# Patient Record
Sex: Female | Born: 1940 | Race: Black or African American | Hispanic: No | Smoking: Never smoker
Health system: Southern US, Community
[De-identification: ages and names within clinical notes are randomized; demographics above are authoritative.]

## PROBLEM LIST (undated history)

## (undated) DIAGNOSIS — I10 Essential (primary) hypertension: Secondary | ICD-10-CM

## (undated) DIAGNOSIS — M1990 Unspecified osteoarthritis, unspecified site: Secondary | ICD-10-CM

---

## 2004-05-22 HISTORY — PX: ELBOW ARTHROPLASTY: SHX928

## 2011-05-23 HISTORY — PX: REPLACEMENT TOTAL KNEE: SUR1224

## 2012-05-22 HISTORY — PX: REPLACEMENT TOTAL KNEE: SUR1224

## 2015-06-11 DIAGNOSIS — I10 Essential (primary) hypertension: Secondary | ICD-10-CM | POA: Diagnosis not present

## 2015-06-11 DIAGNOSIS — Z0000 Encounter for general adult medical examination without abnormal findings: Secondary | ICD-10-CM | POA: Diagnosis not present

## 2015-07-24 DIAGNOSIS — M542 Cervicalgia: Secondary | ICD-10-CM | POA: Diagnosis not present

## 2015-07-24 DIAGNOSIS — Z96621 Presence of right artificial elbow joint: Secondary | ICD-10-CM | POA: Diagnosis not present

## 2015-07-24 DIAGNOSIS — M1990 Unspecified osteoarthritis, unspecified site: Secondary | ICD-10-CM | POA: Diagnosis not present

## 2015-07-24 DIAGNOSIS — Z79899 Other long term (current) drug therapy: Secondary | ICD-10-CM | POA: Diagnosis not present

## 2015-07-24 DIAGNOSIS — M5030 Other cervical disc degeneration, unspecified cervical region: Secondary | ICD-10-CM | POA: Diagnosis not present

## 2015-07-24 DIAGNOSIS — M810 Age-related osteoporosis without current pathological fracture: Secondary | ICD-10-CM | POA: Diagnosis not present

## 2015-07-24 DIAGNOSIS — Z96653 Presence of artificial knee joint, bilateral: Secondary | ICD-10-CM | POA: Diagnosis not present

## 2015-07-24 DIAGNOSIS — I10 Essential (primary) hypertension: Secondary | ICD-10-CM | POA: Diagnosis not present

## 2015-07-24 DIAGNOSIS — M5090 Cervical disc disorder, unspecified, unspecified cervical region: Secondary | ICD-10-CM | POA: Diagnosis not present

## 2015-09-10 DIAGNOSIS — I10 Essential (primary) hypertension: Secondary | ICD-10-CM | POA: Diagnosis not present

## 2015-09-10 DIAGNOSIS — Z0000 Encounter for general adult medical examination without abnormal findings: Secondary | ICD-10-CM | POA: Diagnosis not present

## 2015-09-10 DIAGNOSIS — M542 Cervicalgia: Secondary | ICD-10-CM | POA: Diagnosis not present

## 2015-09-10 DIAGNOSIS — Z1389 Encounter for screening for other disorder: Secondary | ICD-10-CM | POA: Diagnosis not present

## 2015-10-05 DIAGNOSIS — H2513 Age-related nuclear cataract, bilateral: Secondary | ICD-10-CM | POA: Diagnosis not present

## 2015-10-28 DIAGNOSIS — I10 Essential (primary) hypertension: Secondary | ICD-10-CM | POA: Diagnosis not present

## 2015-10-28 DIAGNOSIS — Z79899 Other long term (current) drug therapy: Secondary | ICD-10-CM | POA: Diagnosis not present

## 2015-10-28 DIAGNOSIS — Z981 Arthrodesis status: Secondary | ICD-10-CM | POA: Diagnosis not present

## 2015-10-28 DIAGNOSIS — E559 Vitamin D deficiency, unspecified: Secondary | ICD-10-CM | POA: Diagnosis not present

## 2015-10-28 DIAGNOSIS — M2550 Pain in unspecified joint: Secondary | ICD-10-CM | POA: Diagnosis not present

## 2015-10-28 DIAGNOSIS — Z96653 Presence of artificial knee joint, bilateral: Secondary | ICD-10-CM | POA: Diagnosis not present

## 2015-10-28 DIAGNOSIS — M810 Age-related osteoporosis without current pathological fracture: Secondary | ICD-10-CM | POA: Diagnosis not present

## 2015-11-08 DIAGNOSIS — M810 Age-related osteoporosis without current pathological fracture: Secondary | ICD-10-CM | POA: Diagnosis not present

## 2015-12-16 DIAGNOSIS — Z1231 Encounter for screening mammogram for malignant neoplasm of breast: Secondary | ICD-10-CM | POA: Diagnosis not present

## 2015-12-30 DIAGNOSIS — M542 Cervicalgia: Secondary | ICD-10-CM | POA: Diagnosis not present

## 2015-12-30 DIAGNOSIS — I10 Essential (primary) hypertension: Secondary | ICD-10-CM | POA: Diagnosis not present

## 2016-01-05 DIAGNOSIS — I10 Essential (primary) hypertension: Secondary | ICD-10-CM | POA: Diagnosis not present

## 2016-03-29 DIAGNOSIS — I10 Essential (primary) hypertension: Secondary | ICD-10-CM | POA: Diagnosis not present

## 2016-06-29 DIAGNOSIS — I10 Essential (primary) hypertension: Secondary | ICD-10-CM | POA: Diagnosis not present

## 2016-06-29 DIAGNOSIS — Z1389 Encounter for screening for other disorder: Secondary | ICD-10-CM | POA: Diagnosis not present

## 2016-06-29 DIAGNOSIS — Z131 Encounter for screening for diabetes mellitus: Secondary | ICD-10-CM | POA: Diagnosis not present

## 2016-06-29 DIAGNOSIS — M0689 Other specified rheumatoid arthritis, multiple sites: Secondary | ICD-10-CM | POA: Diagnosis not present

## 2016-06-29 DIAGNOSIS — Z0000 Encounter for general adult medical examination without abnormal findings: Secondary | ICD-10-CM | POA: Diagnosis not present

## 2016-06-29 DIAGNOSIS — Z79899 Other long term (current) drug therapy: Secondary | ICD-10-CM | POA: Diagnosis not present

## 2016-07-25 DIAGNOSIS — H524 Presbyopia: Secondary | ICD-10-CM | POA: Diagnosis not present

## 2016-07-25 DIAGNOSIS — H35363 Drusen (degenerative) of macula, bilateral: Secondary | ICD-10-CM | POA: Diagnosis not present

## 2016-09-14 DIAGNOSIS — H25813 Combined forms of age-related cataract, bilateral: Secondary | ICD-10-CM | POA: Diagnosis not present

## 2016-09-15 DIAGNOSIS — H25812 Combined forms of age-related cataract, left eye: Secondary | ICD-10-CM | POA: Diagnosis not present

## 2016-09-15 NOTE — Patient Instructions (Signed)
Your procedure is scheduled on: 09/22/2016   Report to [facility] at  640   AM.  Call this number if you have problems the morning of surgery: [phone]   Do not eat food or drink liquids :After Midnight.      Take these medicines the morning of surgery with A SIP OF WATER: norvasc, plaquwnil, labetolol, oxycodone.   Do not wear jewelry, make-up or nail polish.  Do not wear lotions, powders, or perfumes. You may wear deodorant.  Do not shave 48 hours prior to surgery.  Do not bring valuables to the hospital.  Contacts, dentures or bridgework may not be worn into surgery.  Leave suitcase in the car. After surgery it may be brought to your room.  For patients admitted to the hospital, checkout time is 11:00 AM the day of discharge.   Patients discharged the day of surgery will not be allowed to drive home.  :     Please read over the following fact sheets that you were given: Coughing and Deep Breathing, Surgical Site Infection Prevention, Anesthesia Post-op Instructions and Care and Recovery After Surgery    Cataract A cataract is a clouding of the lens of the eye. When a lens becomes cloudy, vision is reduced based on the degree and nature of the clouding. Many cataracts reduce vision to some degree. Some cataracts make people more near-sighted as they develop. Other cataracts increase glare. Cataracts that are ignored and become worse can sometimes look white. The white color can be seen through the pupil. CAUSES   Aging. However, cataracts may occur at any age, even in newborns.   Certain drugs.   Trauma to the eye.   Certain diseases such as diabetes.   Specific eye diseases such as chronic inflammation inside the eye or a sudden attack of a rare form of glaucoma.   Inherited or acquired medical problems.  SYMPTOMS   Gradual, progressive drop in vision in the affected eye.   Severe, rapid visual loss. This most often happens when trauma is the cause.  DIAGNOSIS  To detect  a cataract, an eye doctor examines the lens. Cataracts are best diagnosed with an exam of the eyes with the pupils enlarged (dilated) by drops.  TREATMENT  For an early cataract, vision may improve by using different eyeglasses or stronger lighting. If that does not help your vision, surgery is the only effective treatment. A cataract needs to be surgically removed when vision loss interferes with your everyday activities, such as driving, reading, or watching TV. A cataract may also have to be removed if it prevents examination or treatment of another eye problem. Surgery removes the cloudy lens and usually replaces it with a substitute lens (intraocular lens, IOL).  At a time when both you and your doctor agree, the cataract will be surgically removed. If you have cataracts in both eyes, only one is usually removed at a time. This allows the operated eye to heal and be out of danger from any possible problems after surgery (such as infection or poor wound healing). In rare cases, a cataract may be doing damage to your eye. In these cases, your caregiver may advise surgical removal right away. The vast majority of people who have cataract surgery have better vision afterward. HOME CARE INSTRUCTIONS  If you are not planning surgery, you may be asked to do the following:  Use different eyeglasses.   Use stronger or brighter lighting.   Ask your eye doctor about  reducing your medicine dose or changing medicines if it is thought that a medicine caused your cataract. Changing medicines does not make the cataract go away on its own.   Become familiar with your surroundings. Poor vision can lead to injury. Avoid bumping into things on the affected side. You are at a higher risk for tripping or falling.   Exercise extreme care when driving or operating machinery.   Wear sunglasses if you are sensitive to bright light or experiencing problems with glare.  SEEK IMMEDIATE MEDICAL CARE IF:   You have a  worsening or sudden vision loss.   You notice redness, swelling, or increasing pain in the eye.   You have a fever.  Document Released: 05/08/2005 Document Revised: 04/27/2011 Document Reviewed: 12/30/2010 [facility] Patient Information 2012 [place].PATIENT INSTRUCTIONS POST-ANESTHESIA  IMMEDIATELY FOLLOWING SURGERY:  Do not drive or operate machinery for the first twenty four hours after surgery.  Do not make any important decisions for twenty four hours after surgery or while taking narcotic pain medications or sedatives.  If you develop intractable nausea and vomiting or a severe headache please notify your doctor immediately.  FOLLOW-UP:  Please make an appointment with your surgeon as instructed. You do not need to follow up with anesthesia unless specifically instructed to do so.  WOUND CARE INSTRUCTIONS (if applicable):  Keep a dry clean dressing on the anesthesia/puncture wound site if there is drainage.  Once the wound has quit draining you may leave it open to air.  Generally you should leave the bandage intact for twenty four hours unless there is drainage.  If the epidural site drains for more than 36-48 hours please call the anesthesia department.  QUESTIONS?:  Please feel free to call your physician or the hospital operator if you have any questions, and they will be happy to assist you.

## 2016-09-18 ENCOUNTER — Other Ambulatory Visit: Payer: Self-pay

## 2016-09-18 ENCOUNTER — Encounter (HOSPITAL_COMMUNITY)
Admission: RE | Admit: 2016-09-18 | Discharge: 2016-09-18 | Disposition: A | Discharge status: Home / Self Care | Payer: Medicare Other | Source: Ambulatory Visit | Attending: Ophthalmology | Admitting: Ophthalmology

## 2016-09-18 ENCOUNTER — Encounter (HOSPITAL_COMMUNITY): Payer: Self-pay

## 2016-09-18 DIAGNOSIS — Z01812 Encounter for preprocedural laboratory examination: Secondary | ICD-10-CM | POA: Diagnosis not present

## 2016-09-18 DIAGNOSIS — Z01810 Encounter for preprocedural cardiovascular examination: Secondary | ICD-10-CM | POA: Diagnosis not present

## 2016-09-18 HISTORY — DX: Essential (primary) hypertension: I10

## 2016-09-18 HISTORY — DX: Unspecified osteoarthritis, unspecified site: M19.90

## 2016-09-18 LAB — BASIC METABOLIC PANEL
Anion gap: 8 (ref 5–15)
BUN: 16 mg/dL (ref 6–20)
CALCIUM: 9.6 mg/dL (ref 8.9–10.3)
CO2: 27 mmol/L (ref 22–32)
CREATININE: 0.83 mg/dL (ref 0.44–1.00)
Chloride: 101 mmol/L (ref 101–111)
GFR calc non Af Amer: >60 mL/min (ref >60)
GLUCOSE: 98 mg/dL (ref 65–99)
Potassium: 2.7 mmol/L — CL (ref 3.5–5.1)
Sodium: 136 mmol/L (ref 135–145)

## 2016-09-18 LAB — CBC WITH DIFFERENTIAL/PLATELET
BASOS PCT: 0 %
Basophils Absolute: 0 10*3/uL (ref 0.0–0.1)
Eosinophils Absolute: 0.1 10*3/uL (ref 0.0–0.7)
Eosinophils Relative: 1 %
HEMATOCRIT: 31.8 % — AB (ref 36.0–46.0)
Hemoglobin: 10.6 g/dL — ABNORMAL LOW (ref 12.0–15.0)
LYMPHS ABS: 1.8 10*3/uL (ref 0.7–4.0)
Lymphocytes Relative: 30 %
MCH: 28.8 pg (ref 26.0–34.0)
MCHC: 33.3 g/dL (ref 30.0–36.0)
MCV: 86.4 fL (ref 78.0–100.0)
MONO ABS: 0.5 10*3/uL (ref 0.1–1.0)
MONOS PCT: 9 %
NEUTROS ABS: 3.5 10*3/uL (ref 1.7–7.7)
Neutrophils Relative %: 59 %
Platelets: 249 10*3/uL (ref 150–400)
RBC: 3.68 MIL/uL — ABNORMAL LOW (ref 3.87–5.11)
RDW: 12.9 % (ref 11.5–15.5)
WBC: 5.8 10*3/uL (ref 4.0–10.5)

## 2016-09-19 NOTE — Pre-Procedure Instructions (Signed)
Potassium of 2.7 shown to Dr Patsey Berthold. Will call Dr Luciana Axe office when open. Repeat potasium via I[place] morning of surgery. Called Dr Joselyn Arrow office and told them of potassium. Lab results faxed to Amy will will give them to Dr Amanda Pea.

## 2016-09-22 ENCOUNTER — Encounter (HOSPITAL_COMMUNITY)
Admission: RE | Disposition: A | Discharge status: Home / Self Care | Payer: Self-pay | Source: Ambulatory Visit | Attending: Ophthalmology

## 2016-09-22 ENCOUNTER — Ambulatory Visit (HOSPITAL_COMMUNITY): Payer: Medicare Other | Admitting: Anesthesiology

## 2016-09-22 ENCOUNTER — Encounter (HOSPITAL_COMMUNITY): Payer: Self-pay | Admitting: *Deleted

## 2016-09-22 ENCOUNTER — Ambulatory Visit (HOSPITAL_COMMUNITY)
Admission: RE | Admit: 2016-09-22 | Discharge: 2016-09-22 | Disposition: A | Discharge status: Home / Self Care | Payer: Medicare Other | Source: Ambulatory Visit | Attending: Ophthalmology | Admitting: Ophthalmology

## 2016-09-22 DIAGNOSIS — I10 Essential (primary) hypertension: Secondary | ICD-10-CM | POA: Insufficient documentation

## 2016-09-22 DIAGNOSIS — Z79899 Other long term (current) drug therapy: Secondary | ICD-10-CM | POA: Insufficient documentation

## 2016-09-22 DIAGNOSIS — H2512 Age-related nuclear cataract, left eye: Secondary | ICD-10-CM | POA: Insufficient documentation

## 2016-09-22 DIAGNOSIS — H269 Unspecified cataract: Secondary | ICD-10-CM | POA: Diagnosis not present

## 2016-09-22 DIAGNOSIS — H25812 Combined forms of age-related cataract, left eye: Secondary | ICD-10-CM | POA: Diagnosis not present

## 2016-09-22 HISTORY — PX: CATARACT EXTRACTION W/PHACO: SHX586

## 2016-09-22 LAB — POCT I-STAT 4, (NA,K, GLUC, HGB,HCT)
GLUCOSE: 109 mg/dL — AB (ref 65–99)
HEMATOCRIT: 34 % — AB (ref 36.0–46.0)
HEMOGLOBIN: 11.6 g/dL — AB (ref 12.0–15.0)
Potassium: 3.2 mmol/L — ABNORMAL LOW (ref 3.5–5.1)
Sodium: 139 mmol/L (ref 135–145)

## 2016-09-22 SURGERY — PHACOEMULSIFICATION, CATARACT, WITH IOL INSERTION
Anesthesia: Monitor Anesthesia Care | Site: Eye | Laterality: Left

## 2016-09-22 MED ORDER — POVIDONE-IODINE 5 % OP SOLN
OPHTHALMIC | Status: DC | PRN
  Administered 2016-09-22: 1 via OPHTHALMIC

## 2016-09-22 MED ORDER — BSS IO SOLN
INTRAOCULAR | Status: DC | PRN
  Administered 2016-09-22: 15 mL via INTRAOCULAR

## 2016-09-22 MED ORDER — LIDOCAINE HCL (PF) 1 % IJ SOLN
INTRAOCULAR | Status: DC | PRN
  Administered 2016-09-22: 1 mL via OPHTHALMIC

## 2016-09-22 MED ORDER — PROVISC 10 MG/ML IO SOLN
INTRAOCULAR | Status: DC | PRN
  Administered 2016-09-22: 0.85 mL via INTRAOCULAR

## 2016-09-22 MED ORDER — LIDOCAINE HCL 3.5 % OP GEL
1.0000 "application " | Freq: Once | OPHTHALMIC | Status: AC
  Administered 2016-09-22: 1 via OPHTHALMIC

## 2016-09-22 MED ORDER — EPINEPHRINE PF 1 MG/ML IJ SOLN
INTRAOCULAR | Status: DC | PRN
  Administered 2016-09-22: 500 mL

## 2016-09-22 MED ORDER — FENTANYL CITRATE (PF) 100 MCG/2ML IJ SOLN
25.0000 ug | Freq: Once | INTRAMUSCULAR | Status: AC
  Administered 2016-09-22: 25 ug via INTRAVENOUS

## 2016-09-22 MED ORDER — LACTATED RINGERS IV SOLN
INTRAVENOUS | Status: DC
  Administered 2016-09-22: 08:00:00 via INTRAVENOUS

## 2016-09-22 MED ORDER — CYCLOPENTOLATE-PHENYLEPHRINE 0.2-1 % OP SOLN
1.0000 [drp] | OPHTHALMIC | Status: AC
  Administered 2016-09-22 (×3): 1 [drp] via OPHTHALMIC

## 2016-09-22 MED ORDER — EPINEPHRINE PF 1 MG/ML IJ SOLN
INTRAMUSCULAR | Status: AC
  Filled 2016-09-22: qty 2

## 2016-09-22 MED ORDER — NEOMYCIN-POLYMYXIN-DEXAMETH 3.5-10000-0.1 OP SUSP
OPHTHALMIC | Status: DC | PRN
  Administered 2016-09-22: 2 [drp] via OPHTHALMIC

## 2016-09-22 MED ORDER — SODIUM HYALURONATE 23 MG/ML IO SOLN
INTRAOCULAR | Status: DC | PRN
  Administered 2016-09-22 (×3): 0.6 mL via INTRAOCULAR

## 2016-09-22 MED ORDER — TETRACAINE HCL 0.5 % OP SOLN
1.0000 [drp] | OPHTHALMIC | Status: AC
  Administered 2016-09-22 (×3): 1 [drp] via OPHTHALMIC

## 2016-09-22 MED ORDER — MIDAZOLAM HCL 2 MG/2ML IJ SOLN
1.0000 mg | INTRAMUSCULAR | Status: AC
  Administered 2016-09-22: 2 mg via INTRAVENOUS

## 2016-09-22 MED ORDER — NA CHONDROIT SULF-NA HYALURON 40-30 MG/ML IO SOLN
INTRAOCULAR | Status: DC | PRN
  Administered 2016-09-22: 0.5 mL via INTRAOCULAR

## 2016-09-22 MED ORDER — PHENYLEPHRINE HCL 2.5 % OP SOLN
1.0000 [drp] | OPHTHALMIC | Status: AC
  Administered 2016-09-22 (×3): 1 [drp] via OPHTHALMIC

## 2016-09-22 MED ORDER — FENTANYL CITRATE (PF) 100 MCG/2ML IJ SOLN
INTRAMUSCULAR | Status: AC
  Filled 2016-09-22: qty 2

## 2016-09-22 MED ORDER — MIDAZOLAM HCL 2 MG/2ML IJ SOLN
INTRAMUSCULAR | Status: AC
  Filled 2016-09-22: qty 2

## 2016-09-22 MED ORDER — SODIUM CHLORIDE 0.9% FLUSH
INTRAVENOUS | Status: AC
  Filled 2016-09-22: qty 10

## 2016-09-22 SURGICAL SUPPLY — 18 items
CLOTH BEACON ORANGE TIMEOUT ST (SAFETY) ×2 IMPLANT
EYE SHIELD UNIVERSAL CLEAR (GAUZE/BANDAGES/DRESSINGS) ×2 IMPLANT
GLOVE BIOGEL PI IND STRL 6.5 (GLOVE) ×1 IMPLANT
GLOVE BIOGEL PI IND STRL 7.0 (GLOVE) ×1 IMPLANT
GLOVE BIOGEL PI IND STRL 7.5 (GLOVE) ×1 IMPLANT
GLOVE BIOGEL PI INDICATOR 6.5 (GLOVE) ×1
GLOVE BIOGEL PI INDICATOR 7.0 (GLOVE) ×1
GLOVE BIOGEL PI INDICATOR 7.5 (GLOVE) ×1
GLOVE EXAM NITRILE LRG STRL (GLOVE) IMPLANT
GLOVE EXAM NITRILE MD LF STRL (GLOVE) IMPLANT
NEEDLE HYPO 18GX1.5 BLUNT FILL (NEEDLE) ×2 IMPLANT
PAD ARMBOARD 7.5X6 YLW CONV (MISCELLANEOUS) ×2 IMPLANT
RING MALYGIN (MISCELLANEOUS) IMPLANT
SIGHTPATH CAT PROC W REG LENS (Ophthalmic Related) ×2 IMPLANT
SYR TB 1ML LL NO SAFETY (SYRINGE) ×2 IMPLANT
TAPE TRANSPARENT 1/2IN (GAUZE/BANDAGES/DRESSINGS) ×2 IMPLANT
VISCOELASTIC ADDITIONAL (OPHTHALMIC RELATED) ×6 IMPLANT
WATER STERILE IRR 250ML POUR (IV SOLUTION) ×2 IMPLANT

## 2016-09-22 NOTE — Op Note (Signed)
Date of procedure: 09/22/16  Pre-operative diagnosis: Visually significant cataract, Left Eye  Post-operative diagnosis: Visually significant cataract, Left Eye  Procedure: Removal of cataract via phacoemulsification and insertion of intra-ocular lens AMO PCB00  +24.5D into the capsular bag of the Left Eye  Attending surgeon: Gerda Diss. Ainslee Sou, MD, MA  Anesthesia: MAC, Topical Akten  Complications: None  Estimated Blood Loss: <3m (minimal)  Specimens: None  Implants: As above  Indications:  Visually significant cataract, Left Eye  Procedure:  The patient was seen and identified in the pre-operative area. The operative eye was identified and dilated.  The operative eye was marked.  Topical anesthesia was administered to the operative eye.     The patient was then to the operative suite and placed in the supine position.  A timeout was performed confirming the patient, procedure to be performed, and all other relevant information.   The patient's face was prepped and draped in the usual fashion for intra-ocular surgery.  A lid speculum was placed into the operative eye and the surgical microscope moved into place and focused.  A superotemporal paracentesis was created using a 20 gauge paracentesis blade.  Shugarcaine was injected into the anterior chamber.  Viscoelastic was injected into the anterior chamber.  A temporal clear-corneal main wound incision was created using a 2.420mmicrokeratome.  A continuous curvilinear capsulorrhexis was initiated using an irrigating cystitome and completed using capsulorrhexis forceps.  Hydrodissection and hydrodeliniation were performed.  Viscoelastic was injected into the anterior chamber.  A phacoemulsification handpiece and a chopper as a second instrument were used to remove the nucleus and epinucleus. The irrigation/aspiration handpiece was used to remove any remaining cortical material.   The capsular bag was reinflated with viscoelastic, checked,  and found to be intact.  The intraocular lens was inserted into the capsular bag and dialed into place using a Kuglen hook.  The irrigation/aspiration handpiece was used to remove any remaining viscoelastic.  The clear corneal wound and paracentesis wounds were then hydrated and checked with Weck-Cels to be watertight.  The lid-speculum and drape was removed, and the patient's face was cleaned with a wet and dry 4x4.  Maxitrol was instilled in the eye before a clear shield was taped over the eye. The patient was taken to the post-operative care unit in good condition, having tolerated the procedure well.  Post-Op Instructions: [facility] and will receive all other orders and instructions.

## 2016-09-22 NOTE — Transfer of Care (Signed)
Immediate Anesthesia Transfer of Care Note  Patient: Veronica Howard  Procedure(s) Performed: Procedure(s) (LRB): CATARACT EXTRACTION PHACO AND INTRAOCULAR LENS PLACEMENT LEFT EYE CDE= 13.37 (Left)  Patient Location: Shortstay  Anesthesia Type: MAC  Level of Consciousness: awake  Airway & Oxygen Therapy: Patient Spontanous Breathing   Post-op Assessment: Report given to PACU RN, Post -op Vital signs reviewed and stable and Patient moving all extremities  Post vital signs: Reviewed and stable  Complications: No apparent anesthesia complications

## 2016-09-22 NOTE — Anesthesia Postprocedure Evaluation (Signed)
Anesthesia Post Note  Patient: Veronica Howard  Procedure(s) Performed: Procedure(s) (LRB): CATARACT EXTRACTION PHACO AND INTRAOCULAR LENS PLACEMENT LEFT EYE CDE= 13.37 (Left)  Patient location during evaluation: Short Stay Anesthesia Type: MAC Level of consciousness: awake and alert Pain management: pain level controlled Vital Signs Assessment: post-procedure vital signs reviewed and stable Respiratory status: spontaneous breathing Cardiovascular status: stable Anesthetic complications: no     Last Vitals:  Vitals:   09/22/16 0805 09/22/16 0810  BP:    Pulse:    Resp: 14 17  Temp:      Last Pain:  Vitals:   09/22/16 0704  TempSrc: Oral                 Damire Remedios

## 2016-09-22 NOTE — Anesthesia Procedure Notes (Signed)
Procedure Name: MAC Date/Time: 09/22/2016 8:09 AM Performed by: Vista Deck Pre-anesthesia Checklist: Patient identified, Emergency Drugs available, Suction available, Timeout performed and Patient being monitored Patient Re-evaluated:Patient Re-evaluated prior to inductionOxygen Delivery Method: Nasal Cannula

## 2016-09-22 NOTE — Anesthesia Preprocedure Evaluation (Signed)
Anesthesia Evaluation  Patient identified by MRN, date of birth, ID band Patient awake    Reviewed: Allergy & Precautions, NPO status , Patient's Chart, lab work & pertinent test results  Airway Mallampati: II  TM Distance: >3 FB Neck ROM: Full    Dental  (+) Edentulous Upper, Edentulous Lower   Pulmonary neg pulmonary ROS,    breath sounds clear to auscultation       Cardiovascular hypertension, Pt. on medications  Rhythm:Regular Rate:Normal     Neuro/Psych    GI/Hepatic negative GI ROS,   Endo/Other    Renal/GU      Musculoskeletal   Abdominal   Peds  Hematology   Anesthesia Other Findings   Reproductive/Obstetrics                             Anesthesia Physical Anesthesia Plan  ASA: II  Anesthesia Plan: MAC   Post-op Pain Management:    Induction:   Airway Management Planned: Nasal Cannula  Additional Equipment:   Intra-op Plan:   Post-operative Plan:   Informed Consent: I have reviewed the patients History and Physical, chart, labs and discussed the procedure including the risks, benefits and alternatives for the proposed anesthesia with the patient or authorized representative who has indicated his/her understanding and acceptance.     Plan Discussed with:   Anesthesia Plan Comments:         Anesthesia Quick Evaluation

## 2016-09-22 NOTE — H&P (Signed)
The H and P was reviewed and updated. The patient was examined.  No changes were found after exam.  The surgical eye was marked.  

## 2016-09-22 NOTE — Discharge Instructions (Signed)
Please discharge patient when stable, will follow up today with Dr. Marisa Hua at the [facility] office at 10:30AM.  Leave shield in place until visit.  All paperwork with discharge instructions will be given at the office.  PATIENT INSTRUCTIONS POST-ANESTHESIA  IMMEDIATELY FOLLOWING SURGERY:  Do not drive or operate machinery for the first twenty four hours after surgery.  Do not make any important decisions for twenty four hours after surgery or while taking narcotic pain medications or sedatives.  If you develop intractable nausea and vomiting or a severe headache please notify your doctor immediately.  FOLLOW-UP:  Please make an appointment with your surgeon as instructed. You do not need to follow up with anesthesia unless specifically instructed to do so.  WOUND CARE INSTRUCTIONS (if applicable):  Keep a dry clean dressing on the anesthesia/puncture wound site if there is drainage.  Once the wound has quit draining you may leave it open to air.  Generally you should leave the bandage intact for twenty four hours unless there is drainage.  If the epidural site drains for more than 36-48 hours please call the anesthesia department.  QUESTIONS?:  Please feel free to call your physician or the hospital operator if you have any questions, and they will be happy to assist you.

## 2016-09-25 ENCOUNTER — Encounter (HOSPITAL_COMMUNITY): Payer: Self-pay | Admitting: Ophthalmology

## 2016-09-26 DIAGNOSIS — M0689 Other specified rheumatoid arthritis, multiple sites: Secondary | ICD-10-CM | POA: Diagnosis not present

## 2016-09-26 DIAGNOSIS — I10 Essential (primary) hypertension: Secondary | ICD-10-CM | POA: Diagnosis not present

## 2016-11-04 DIAGNOSIS — M25521 Pain in right elbow: Secondary | ICD-10-CM | POA: Diagnosis not present

## 2016-11-04 DIAGNOSIS — R2231 Localized swelling, mass and lump, right upper limb: Secondary | ICD-10-CM | POA: Diagnosis not present

## 2016-11-04 DIAGNOSIS — Z96621 Presence of right artificial elbow joint: Secondary | ICD-10-CM | POA: Diagnosis not present

## 2016-11-04 DIAGNOSIS — Y998 Other external cause status: Secondary | ICD-10-CM | POA: Diagnosis not present

## 2016-11-04 DIAGNOSIS — Z471 Aftercare following joint replacement surgery: Secondary | ICD-10-CM | POA: Diagnosis not present

## 2016-11-04 DIAGNOSIS — I10 Essential (primary) hypertension: Secondary | ICD-10-CM | POA: Diagnosis not present

## 2016-11-04 DIAGNOSIS — T84028A Dislocation of other internal joint prosthesis, initial encounter: Secondary | ICD-10-CM | POA: Diagnosis not present

## 2016-11-04 DIAGNOSIS — M069 Rheumatoid arthritis, unspecified: Secondary | ICD-10-CM | POA: Diagnosis not present

## 2016-11-04 DIAGNOSIS — T84098A Other mechanical complication of other internal joint prosthesis, initial encounter: Secondary | ICD-10-CM | POA: Diagnosis not present

## 2016-11-04 DIAGNOSIS — M25421 Effusion, right elbow: Secondary | ICD-10-CM | POA: Diagnosis not present

## 2016-11-04 DIAGNOSIS — X500XXA Overexertion from strenuous movement or load, initial encounter: Secondary | ICD-10-CM | POA: Diagnosis not present

## 2016-11-04 DIAGNOSIS — M1990 Unspecified osteoarthritis, unspecified site: Secondary | ICD-10-CM | POA: Diagnosis not present

## 2016-11-04 DIAGNOSIS — M810 Age-related osteoporosis without current pathological fracture: Secondary | ICD-10-CM | POA: Diagnosis not present

## 2016-11-04 DIAGNOSIS — Z79899 Other long term (current) drug therapy: Secondary | ICD-10-CM | POA: Diagnosis not present

## 2016-11-08 DIAGNOSIS — Z79899 Other long term (current) drug therapy: Secondary | ICD-10-CM | POA: Diagnosis not present

## 2016-11-10 DIAGNOSIS — S53104D Unspecified dislocation of right ulnohumeral joint, subsequent encounter: Secondary | ICD-10-CM | POA: Diagnosis not present

## 2016-11-10 DIAGNOSIS — M89531 Osteolysis, right forearm: Secondary | ICD-10-CM | POA: Diagnosis not present

## 2016-12-07 DIAGNOSIS — M89531 Osteolysis, right forearm: Secondary | ICD-10-CM | POA: Diagnosis not present

## 2016-12-07 DIAGNOSIS — T84019D Broken internal joint prosthesis, unspecified site, subsequent encounter: Secondary | ICD-10-CM | POA: Diagnosis not present

## 2016-12-07 DIAGNOSIS — Z96621 Presence of right artificial elbow joint: Secondary | ICD-10-CM | POA: Diagnosis not present

## 2016-12-07 DIAGNOSIS — M85831 Other specified disorders of bone density and structure, right forearm: Secondary | ICD-10-CM | POA: Diagnosis not present

## 2016-12-07 DIAGNOSIS — T84112A Breakdown (mechanical) of internal fixation device of bone of right forearm, initial encounter: Secondary | ICD-10-CM | POA: Diagnosis not present

## 2016-12-07 DIAGNOSIS — M7989 Other specified soft tissue disorders: Secondary | ICD-10-CM | POA: Diagnosis not present

## 2016-12-18 DIAGNOSIS — I10 Essential (primary) hypertension: Secondary | ICD-10-CM | POA: Diagnosis not present

## 2016-12-18 DIAGNOSIS — T84098A Other mechanical complication of other internal joint prosthesis, initial encounter: Secondary | ICD-10-CM | POA: Diagnosis not present

## 2016-12-18 DIAGNOSIS — M1990 Unspecified osteoarthritis, unspecified site: Secondary | ICD-10-CM | POA: Diagnosis not present

## 2016-12-25 DIAGNOSIS — M545 Low back pain: Secondary | ICD-10-CM | POA: Diagnosis not present

## 2016-12-25 DIAGNOSIS — I10 Essential (primary) hypertension: Secondary | ICD-10-CM | POA: Diagnosis not present

## 2016-12-25 DIAGNOSIS — M0689 Other specified rheumatoid arthritis, multiple sites: Secondary | ICD-10-CM | POA: Diagnosis not present

## 2017-01-17 DIAGNOSIS — T84098A Other mechanical complication of other internal joint prosthesis, initial encounter: Secondary | ICD-10-CM | POA: Diagnosis not present

## 2017-01-17 DIAGNOSIS — M1990 Unspecified osteoarthritis, unspecified site: Secondary | ICD-10-CM | POA: Diagnosis not present

## 2017-01-17 DIAGNOSIS — Z96621 Presence of right artificial elbow joint: Secondary | ICD-10-CM | POA: Diagnosis not present

## 2017-01-17 DIAGNOSIS — M898X3 Other specified disorders of bone, forearm: Secondary | ICD-10-CM | POA: Diagnosis not present

## 2017-01-17 DIAGNOSIS — M9741XA Periprosthetic fracture around internal prosthetic right elbow joint, initial encounter: Secondary | ICD-10-CM | POA: Diagnosis not present

## 2017-01-17 DIAGNOSIS — G8918 Other acute postprocedural pain: Secondary | ICD-10-CM | POA: Diagnosis not present

## 2017-01-17 DIAGNOSIS — M25521 Pain in right elbow: Secondary | ICD-10-CM | POA: Diagnosis not present

## 2017-01-17 DIAGNOSIS — T8489XA Other specified complication of internal orthopedic prosthetic devices, implants and grafts, initial encounter: Secondary | ICD-10-CM | POA: Diagnosis not present

## 2017-01-17 DIAGNOSIS — I10 Essential (primary) hypertension: Secondary | ICD-10-CM | POA: Diagnosis not present

## 2017-01-17 DIAGNOSIS — T84019D Broken internal joint prosthesis, unspecified site, subsequent encounter: Secondary | ICD-10-CM | POA: Diagnosis not present

## 2017-01-17 DIAGNOSIS — G8929 Other chronic pain: Secondary | ICD-10-CM | POA: Diagnosis not present

## 2017-02-01 DIAGNOSIS — T84019D Broken internal joint prosthesis, unspecified site, subsequent encounter: Secondary | ICD-10-CM | POA: Diagnosis not present

## 2017-02-01 DIAGNOSIS — Z96621 Presence of right artificial elbow joint: Secondary | ICD-10-CM | POA: Diagnosis not present

## 2017-03-22 DIAGNOSIS — T84019D Broken internal joint prosthesis, unspecified site, subsequent encounter: Secondary | ICD-10-CM | POA: Diagnosis not present

## 2017-03-22 DIAGNOSIS — Z09 Encounter for follow-up examination after completed treatment for conditions other than malignant neoplasm: Secondary | ICD-10-CM | POA: Diagnosis not present

## 2017-03-22 DIAGNOSIS — Z9889 Other specified postprocedural states: Secondary | ICD-10-CM | POA: Diagnosis not present

## 2017-03-22 DIAGNOSIS — S53104D Unspecified dislocation of right ulnohumeral joint, subsequent encounter: Secondary | ICD-10-CM | POA: Diagnosis not present

## 2017-03-22 DIAGNOSIS — Z96621 Presence of right artificial elbow joint: Secondary | ICD-10-CM | POA: Diagnosis not present

## 2017-03-27 DIAGNOSIS — M0689 Other specified rheumatoid arthritis, multiple sites: Secondary | ICD-10-CM | POA: Diagnosis not present

## 2017-03-27 DIAGNOSIS — M545 Low back pain: Secondary | ICD-10-CM | POA: Diagnosis not present

## 2017-03-27 DIAGNOSIS — I10 Essential (primary) hypertension: Secondary | ICD-10-CM | POA: Diagnosis not present

## 2017-03-27 DIAGNOSIS — Z6824 Body mass index (BMI) 24.0-24.9, adult: Secondary | ICD-10-CM | POA: Diagnosis not present

## 2017-05-22 HISTORY — PX: TOTAL ELBOW REPLACEMENT: SUR1214

## 2017-05-31 DIAGNOSIS — S53104S Unspecified dislocation of right ulnohumeral joint, sequela: Secondary | ICD-10-CM | POA: Diagnosis not present

## 2017-05-31 DIAGNOSIS — Z96621 Presence of right artificial elbow joint: Secondary | ICD-10-CM | POA: Diagnosis not present

## 2017-05-31 DIAGNOSIS — T84018D Broken internal joint prosthesis, other site, subsequent encounter: Secondary | ICD-10-CM | POA: Diagnosis not present

## 2017-05-31 DIAGNOSIS — Z471 Aftercare following joint replacement surgery: Secondary | ICD-10-CM | POA: Diagnosis not present

## 2017-06-26 DIAGNOSIS — M0689 Other specified rheumatoid arthritis, multiple sites: Secondary | ICD-10-CM | POA: Diagnosis not present

## 2017-06-26 DIAGNOSIS — Z6824 Body mass index (BMI) 24.0-24.9, adult: Secondary | ICD-10-CM | POA: Diagnosis not present

## 2017-06-26 DIAGNOSIS — M545 Low back pain: Secondary | ICD-10-CM | POA: Diagnosis not present

## 2017-06-26 DIAGNOSIS — I10 Essential (primary) hypertension: Secondary | ICD-10-CM | POA: Diagnosis not present

## 2017-10-01 DIAGNOSIS — M0689 Other specified rheumatoid arthritis, multiple sites: Secondary | ICD-10-CM | POA: Diagnosis not present

## 2017-10-01 DIAGNOSIS — Z0000 Encounter for general adult medical examination without abnormal findings: Secondary | ICD-10-CM | POA: Diagnosis not present

## 2017-10-01 DIAGNOSIS — Z6824 Body mass index (BMI) 24.0-24.9, adult: Secondary | ICD-10-CM | POA: Diagnosis not present

## 2017-10-01 DIAGNOSIS — Z1389 Encounter for screening for other disorder: Secondary | ICD-10-CM | POA: Diagnosis not present

## 2017-10-01 DIAGNOSIS — M545 Low back pain: Secondary | ICD-10-CM | POA: Diagnosis not present

## 2017-10-01 DIAGNOSIS — I10 Essential (primary) hypertension: Secondary | ICD-10-CM | POA: Diagnosis not present

## 2017-10-09 DIAGNOSIS — T84019S Broken internal joint prosthesis, unspecified site, sequela: Secondary | ICD-10-CM | POA: Diagnosis not present

## 2017-10-09 DIAGNOSIS — Z886 Allergy status to analgesic agent status: Secondary | ICD-10-CM | POA: Diagnosis not present

## 2017-10-09 DIAGNOSIS — T84018A Broken internal joint prosthesis, other site, initial encounter: Secondary | ICD-10-CM | POA: Diagnosis not present

## 2017-10-09 DIAGNOSIS — Z96621 Presence of right artificial elbow joint: Secondary | ICD-10-CM | POA: Diagnosis not present

## 2017-10-09 DIAGNOSIS — Z471 Aftercare following joint replacement surgery: Secondary | ICD-10-CM | POA: Diagnosis not present

## 2017-10-09 DIAGNOSIS — T84098A Other mechanical complication of other internal joint prosthesis, initial encounter: Secondary | ICD-10-CM | POA: Diagnosis not present

## 2017-10-09 DIAGNOSIS — Z96651 Presence of right artificial knee joint: Secondary | ICD-10-CM | POA: Diagnosis not present

## 2017-10-09 DIAGNOSIS — S53104S Unspecified dislocation of right ulnohumeral joint, sequela: Secondary | ICD-10-CM | POA: Diagnosis not present

## 2017-11-15 ENCOUNTER — Other Ambulatory Visit: Payer: Self-pay

## 2017-11-15 NOTE — Patient Outreach (Signed)
[place] [facility]) Care Management  11/15/2017  BEAUTIFUL PENSYL Sep 12, 1940 [id number]   Medication Adherence call to Mrs. Veronica Howard spoke with patient she said she is expecting a prescription deliver from the pharmacy on [address] 20/25 today .Veronica Howard is showing past due under [place] health care Ins.  [place] Management Direct Dial [phone]  Fax [phone] [email or web address]

## 2017-12-31 DIAGNOSIS — M545 Low back pain: Secondary | ICD-10-CM | POA: Diagnosis not present

## 2017-12-31 DIAGNOSIS — I10 Essential (primary) hypertension: Secondary | ICD-10-CM | POA: Diagnosis not present

## 2017-12-31 DIAGNOSIS — Z6824 Body mass index (BMI) 24.0-24.9, adult: Secondary | ICD-10-CM | POA: Diagnosis not present

## 2017-12-31 DIAGNOSIS — Z0000 Encounter for general adult medical examination without abnormal findings: Secondary | ICD-10-CM | POA: Diagnosis not present

## 2017-12-31 DIAGNOSIS — M0689 Other specified rheumatoid arthritis, multiple sites: Secondary | ICD-10-CM | POA: Diagnosis not present

## 2018-01-29 DIAGNOSIS — H25011 Cortical age-related cataract, right eye: Secondary | ICD-10-CM | POA: Diagnosis not present

## 2018-01-29 DIAGNOSIS — H524 Presbyopia: Secondary | ICD-10-CM | POA: Diagnosis not present

## 2018-01-31 DIAGNOSIS — M810 Age-related osteoporosis without current pathological fracture: Secondary | ICD-10-CM | POA: Diagnosis not present

## 2018-02-12 DIAGNOSIS — Z1231 Encounter for screening mammogram for malignant neoplasm of breast: Secondary | ICD-10-CM | POA: Diagnosis not present

## 2018-04-02 DIAGNOSIS — M545 Low back pain: Secondary | ICD-10-CM | POA: Diagnosis not present

## 2018-04-02 DIAGNOSIS — Z6824 Body mass index (BMI) 24.0-24.9, adult: Secondary | ICD-10-CM | POA: Diagnosis not present

## 2018-04-02 DIAGNOSIS — M0689 Other specified rheumatoid arthritis, multiple sites: Secondary | ICD-10-CM | POA: Diagnosis not present

## 2018-04-02 DIAGNOSIS — I10 Essential (primary) hypertension: Secondary | ICD-10-CM | POA: Diagnosis not present

## 2018-06-27 DIAGNOSIS — M0689 Other specified rheumatoid arthritis, multiple sites: Secondary | ICD-10-CM | POA: Diagnosis not present

## 2018-06-27 DIAGNOSIS — Z6824 Body mass index (BMI) 24.0-24.9, adult: Secondary | ICD-10-CM | POA: Diagnosis not present

## 2018-06-27 DIAGNOSIS — Z1389 Encounter for screening for other disorder: Secondary | ICD-10-CM | POA: Diagnosis not present

## 2018-06-27 DIAGNOSIS — M545 Low back pain: Secondary | ICD-10-CM | POA: Diagnosis not present

## 2018-06-27 DIAGNOSIS — I10 Essential (primary) hypertension: Secondary | ICD-10-CM | POA: Diagnosis not present

## 2018-06-27 DIAGNOSIS — Z0000 Encounter for general adult medical examination without abnormal findings: Secondary | ICD-10-CM | POA: Diagnosis not present

## 2018-06-28 DIAGNOSIS — R5383 Other fatigue: Secondary | ICD-10-CM | POA: Diagnosis not present

## 2018-06-28 DIAGNOSIS — R5382 Chronic fatigue, unspecified: Secondary | ICD-10-CM | POA: Diagnosis not present

## 2018-09-26 DIAGNOSIS — I10 Essential (primary) hypertension: Secondary | ICD-10-CM | POA: Diagnosis not present

## 2018-09-26 DIAGNOSIS — M0689 Other specified rheumatoid arthritis, multiple sites: Secondary | ICD-10-CM | POA: Diagnosis not present

## 2018-09-26 DIAGNOSIS — M545 Low back pain: Secondary | ICD-10-CM | POA: Diagnosis not present

## 2018-09-26 DIAGNOSIS — Z0000 Encounter for general adult medical examination without abnormal findings: Secondary | ICD-10-CM | POA: Diagnosis not present

## 2018-12-26 DIAGNOSIS — I10 Essential (primary) hypertension: Secondary | ICD-10-CM | POA: Diagnosis not present

## 2018-12-26 DIAGNOSIS — M0689 Other specified rheumatoid arthritis, multiple sites: Secondary | ICD-10-CM | POA: Diagnosis not present

## 2018-12-26 DIAGNOSIS — M545 Low back pain: Secondary | ICD-10-CM | POA: Diagnosis not present

## 2019-01-13 DIAGNOSIS — Z96621 Presence of right artificial elbow joint: Secondary | ICD-10-CM | POA: Diagnosis not present

## 2019-01-13 DIAGNOSIS — M8588 Other specified disorders of bone density and structure, other site: Secondary | ICD-10-CM | POA: Diagnosis not present

## 2019-01-13 DIAGNOSIS — T84018S Broken internal joint prosthesis, other site, sequela: Secondary | ICD-10-CM | POA: Diagnosis not present

## 2019-01-13 DIAGNOSIS — Z471 Aftercare following joint replacement surgery: Secondary | ICD-10-CM | POA: Diagnosis not present

## 2019-01-13 DIAGNOSIS — T84019D Broken internal joint prosthesis, unspecified site, subsequent encounter: Secondary | ICD-10-CM | POA: Diagnosis not present

## 2019-03-27 DIAGNOSIS — I10 Essential (primary) hypertension: Secondary | ICD-10-CM | POA: Diagnosis not present

## 2019-03-27 DIAGNOSIS — M0689 Other specified rheumatoid arthritis, multiple sites: Secondary | ICD-10-CM | POA: Diagnosis not present

## 2019-03-27 DIAGNOSIS — M545 Low back pain: Secondary | ICD-10-CM | POA: Diagnosis not present

## 2019-06-26 DIAGNOSIS — M545 Low back pain: Secondary | ICD-10-CM | POA: Diagnosis not present

## 2019-06-26 DIAGNOSIS — Z0000 Encounter for general adult medical examination without abnormal findings: Secondary | ICD-10-CM | POA: Diagnosis not present

## 2019-06-26 DIAGNOSIS — M0689 Other specified rheumatoid arthritis, multiple sites: Secondary | ICD-10-CM | POA: Diagnosis not present

## 2019-06-26 DIAGNOSIS — I10 Essential (primary) hypertension: Secondary | ICD-10-CM | POA: Diagnosis not present

## 2019-07-03 ENCOUNTER — Other Ambulatory Visit: Payer: Self-pay

## 2019-07-03 NOTE — Patient Outreach (Signed)
[facility] Care Management  07/03/2019  Veronica Howard 04/25/1941 [id number]   Medication Adherence call to [name] [facility] [employer] spoke with patient she is past due on Lisinopril/Hctz 20/25 mg,patient explain she takes 1 tablet daily and has already pick up from the pharmacy and has enough for a month. [name] is showing past due under [place].  [place] Management Direct Dial [phone]  Fax [phone] [email or web address]

## 2019-09-25 DIAGNOSIS — I10 Essential (primary) hypertension: Secondary | ICD-10-CM | POA: Diagnosis not present

## 2019-09-25 DIAGNOSIS — M0689 Other specified rheumatoid arthritis, multiple sites: Secondary | ICD-10-CM | POA: Diagnosis not present

## 2019-09-25 DIAGNOSIS — Z0000 Encounter for general adult medical examination without abnormal findings: Secondary | ICD-10-CM | POA: Diagnosis not present

## 2019-09-25 DIAGNOSIS — M545 Low back pain: Secondary | ICD-10-CM | POA: Diagnosis not present

## 2019-12-05 DIAGNOSIS — Z1231 Encounter for screening mammogram for malignant neoplasm of breast: Secondary | ICD-10-CM | POA: Diagnosis not present

## 2019-12-30 DIAGNOSIS — Z0000 Encounter for general adult medical examination without abnormal findings: Secondary | ICD-10-CM | POA: Diagnosis not present

## 2019-12-30 DIAGNOSIS — M0689 Other specified rheumatoid arthritis, multiple sites: Secondary | ICD-10-CM | POA: Diagnosis not present

## 2019-12-30 DIAGNOSIS — M545 Low back pain: Secondary | ICD-10-CM | POA: Diagnosis not present

## 2019-12-30 DIAGNOSIS — I10 Essential (primary) hypertension: Secondary | ICD-10-CM | POA: Diagnosis not present

## 2020-01-13 DIAGNOSIS — M89531 Osteolysis, right forearm: Secondary | ICD-10-CM | POA: Diagnosis not present

## 2020-01-13 DIAGNOSIS — Z96621 Presence of right artificial elbow joint: Secondary | ICD-10-CM | POA: Diagnosis not present

## 2020-01-13 DIAGNOSIS — M8588 Other specified disorders of bone density and structure, other site: Secondary | ICD-10-CM | POA: Diagnosis not present

## 2020-01-13 DIAGNOSIS — Z471 Aftercare following joint replacement surgery: Secondary | ICD-10-CM | POA: Diagnosis not present

## 2020-03-02 DIAGNOSIS — Z780 Asymptomatic menopausal state: Secondary | ICD-10-CM | POA: Diagnosis not present

## 2020-03-02 DIAGNOSIS — M810 Age-related osteoporosis without current pathological fracture: Secondary | ICD-10-CM | POA: Diagnosis not present

## 2020-03-30 DIAGNOSIS — M545 Low back pain, unspecified: Secondary | ICD-10-CM | POA: Diagnosis not present

## 2020-03-30 DIAGNOSIS — M810 Age-related osteoporosis without current pathological fracture: Secondary | ICD-10-CM | POA: Diagnosis not present

## 2020-03-30 DIAGNOSIS — J040 Acute laryngitis: Secondary | ICD-10-CM | POA: Diagnosis not present

## 2020-03-30 DIAGNOSIS — I10 Essential (primary) hypertension: Secondary | ICD-10-CM | POA: Diagnosis not present

## 2020-03-30 DIAGNOSIS — M0689 Other specified rheumatoid arthritis, multiple sites: Secondary | ICD-10-CM | POA: Diagnosis not present

## 2020-06-04 DIAGNOSIS — K529 Noninfective gastroenteritis and colitis, unspecified: Secondary | ICD-10-CM | POA: Diagnosis not present

## 2020-06-04 DIAGNOSIS — R5383 Other fatigue: Secondary | ICD-10-CM | POA: Diagnosis not present

## 2020-06-04 DIAGNOSIS — R197 Diarrhea, unspecified: Secondary | ICD-10-CM | POA: Diagnosis not present

## 2020-06-04 DIAGNOSIS — R110 Nausea: Secondary | ICD-10-CM | POA: Diagnosis not present

## 2020-06-29 DIAGNOSIS — M0689 Other specified rheumatoid arthritis, multiple sites: Secondary | ICD-10-CM | POA: Diagnosis not present

## 2020-06-29 DIAGNOSIS — M545 Low back pain, unspecified: Secondary | ICD-10-CM | POA: Diagnosis not present

## 2020-06-29 DIAGNOSIS — I10 Essential (primary) hypertension: Secondary | ICD-10-CM | POA: Diagnosis not present

## 2020-06-29 DIAGNOSIS — Z0000 Encounter for general adult medical examination without abnormal findings: Secondary | ICD-10-CM | POA: Diagnosis not present

## 2020-09-20 DIAGNOSIS — H353131 Nonexudative age-related macular degeneration, bilateral, early dry stage: Secondary | ICD-10-CM | POA: Diagnosis not present

## 2020-09-21 DIAGNOSIS — M545 Low back pain, unspecified: Secondary | ICD-10-CM | POA: Diagnosis not present

## 2020-09-21 DIAGNOSIS — I10 Essential (primary) hypertension: Secondary | ICD-10-CM | POA: Diagnosis not present

## 2020-09-21 DIAGNOSIS — Z0000 Encounter for general adult medical examination without abnormal findings: Secondary | ICD-10-CM | POA: Diagnosis not present

## 2020-09-21 DIAGNOSIS — M0689 Other specified rheumatoid arthritis, multiple sites: Secondary | ICD-10-CM | POA: Diagnosis not present

## 2020-09-28 ENCOUNTER — Encounter (INDEPENDENT_AMBULATORY_CARE_PROVIDER_SITE_OTHER): Payer: Self-pay | Admitting: *Deleted

## 2020-10-12 DIAGNOSIS — M810 Age-related osteoporosis without current pathological fracture: Secondary | ICD-10-CM | POA: Diagnosis not present

## 2020-12-20 DIAGNOSIS — H01004 Unspecified blepharitis left upper eyelid: Secondary | ICD-10-CM | POA: Diagnosis not present

## 2020-12-20 DIAGNOSIS — H01001 Unspecified blepharitis right upper eyelid: Secondary | ICD-10-CM | POA: Diagnosis not present

## 2020-12-20 DIAGNOSIS — H01002 Unspecified blepharitis right lower eyelid: Secondary | ICD-10-CM | POA: Diagnosis not present

## 2020-12-20 DIAGNOSIS — H2511 Age-related nuclear cataract, right eye: Secondary | ICD-10-CM | POA: Diagnosis not present

## 2020-12-21 DIAGNOSIS — Z0000 Encounter for general adult medical examination without abnormal findings: Secondary | ICD-10-CM | POA: Diagnosis not present

## 2020-12-21 DIAGNOSIS — I10 Essential (primary) hypertension: Secondary | ICD-10-CM | POA: Diagnosis not present

## 2020-12-21 DIAGNOSIS — M545 Low back pain, unspecified: Secondary | ICD-10-CM | POA: Diagnosis not present

## 2020-12-21 DIAGNOSIS — M0689 Other specified rheumatoid arthritis, multiple sites: Secondary | ICD-10-CM | POA: Diagnosis not present

## 2020-12-22 NOTE — H&P (Addendum)
Surgical History & Physical  Patient Name: Veronica Howard                    DOB: [birth year]  Surgery: Cataract extraction with intraocular lens implant phacoemulsification; Right Eye  Surgeon: Baruch Goldmann MD Surgery Date:  02/04/2021 Pre-Op Date:  01/27/2021  HPI: A 61 Yr. old female patient Pt referred by Dr. Rosana Hoes for cataract evaluation. The patient complains of cloudy vision at night, which began 4 year ago. The right eye is affected. The episode is gradual. The condition's severity is worsening. The complaint is associated with blurry vision and glare. Pt wearing otc readers to help with near. Pt states distance vision is too cloudy for glasses. Symptoms negatively affecting pt's quality of life. Pt denies any eye pain or increase in floaters/flashes of light. HPI was performed by Baruch Goldmann .  Medical History: Dry Eyes Macula Degeneration Cataracts psuedophakia OS HTN Retinopathy OU PVD OU Arthritis High Blood Pressure Hypertension Neuropathy  Review of Systems Negative Allergic/Immunologic Negative Cardiovascular Negative Constitutional Negative Ear, Nose, Mouth & Throat Negative Endocrine Negative Eyes Negative Gastrointestinal Negative Genitourinary Negative Hemotologic/Lymphatic Negative Integumentary Negative Musculoskeletal Negative Neurological Negative Psychiatry Negative Respiratory  Social   Never smoked   Medication Calcium, Folic acid, Oxycodone, Potassium, Arthritis medicine, Bone pill, High blood pressure medicine, Vitamin D,   Sx/Procedures Phaco c IOL,  Elbow Replacement, Knee Replacement x2,   Drug Allergies  Tylenol,   History & Physical: Heent:  Cataract, Right eye NECK: supple without bruits LUNGS: lungs clear to auscultation CV: regular rate and rhythm Abdomen: soft and non-tender  Impression & Plan: Assessment: 1.  NUCLEAR SCLEROSIS AGE RELATED; Right Eye (H25.11) 2.  INTRAOCULAR LENS IOL (Z96.1) 3.  BLEPHARITIS; Right Upper  Lid, Right Lower Lid, Left Upper Lid, Left Lower Lid (H01.001, H01.002,H01.004,H01.005)  Plan: 1.  Cataract accounts for the patient's decreased vision. This visual impairment is not correctable with a tolerable change in glasses or contact lenses. Cataract surgery with an implantation of a new lens should significantly improve the visual and functional status of the patient. Discussed all risks, benefits, alternatives, and potential complications. Discussed the procedures and recovery. Patient desires to have surgery. A-scan ordered and performed today for intra-ocular lens calculations. The surgery will be performed in order to improve vision for driving, reading, and for eye examinations. Recommend phacoemulsification with intra-ocular lens. Recommend Dextenza for post-operative pain and inflammation. Right Eye. Surgery required to correct imbalance of vision. Dilates poorly - shugacaine by protocol. Malyugin Ring. Omidira.  2.  PCB00 +24.5 Doing well since surgery  3.  Recommend regular lid cleaning.

## 2020-12-24 ENCOUNTER — Encounter (HOSPITAL_COMMUNITY)
Admission: RE | Admit: 2020-12-24 | Discharge: 2020-12-24 | Disposition: A | Discharge status: Home / Self Care | Payer: Medicare Other | Source: Ambulatory Visit | Attending: Ophthalmology | Admitting: Ophthalmology

## 2020-12-24 ENCOUNTER — Other Ambulatory Visit: Payer: Self-pay

## 2021-01-13 DIAGNOSIS — Z96621 Presence of right artificial elbow joint: Secondary | ICD-10-CM | POA: Diagnosis not present

## 2021-01-13 DIAGNOSIS — S53104S Unspecified dislocation of right ulnohumeral joint, sequela: Secondary | ICD-10-CM | POA: Diagnosis not present

## 2021-01-13 DIAGNOSIS — Z471 Aftercare following joint replacement surgery: Secondary | ICD-10-CM | POA: Diagnosis not present

## 2021-01-27 DIAGNOSIS — H25811 Combined forms of age-related cataract, right eye: Secondary | ICD-10-CM | POA: Diagnosis not present

## 2021-02-01 ENCOUNTER — Other Ambulatory Visit: Payer: Self-pay

## 2021-02-01 ENCOUNTER — Encounter (HOSPITAL_COMMUNITY)
Admission: RE | Admit: 2021-02-01 | Discharge: 2021-02-01 | Disposition: A | Discharge status: Home / Self Care | Payer: Medicare Other | Source: Ambulatory Visit | Attending: Ophthalmology | Admitting: Ophthalmology

## 2021-02-01 NOTE — Pre-Procedure Instructions (Signed)
Attempted to call patient for preop phone call and there was no answer. I left a voicemail for her to call us back.

## 2021-02-03 ENCOUNTER — Ambulatory Visit (INDEPENDENT_AMBULATORY_CARE_PROVIDER_SITE_OTHER): Payer: Medicare Other | Admitting: Gastroenterology

## 2021-02-04 ENCOUNTER — Ambulatory Visit (HOSPITAL_COMMUNITY)
Admission: RE | Admit: 2021-02-04 | Discharge: 2021-02-04 | Disposition: A | Discharge status: Home / Self Care | Payer: Medicare Other | Attending: Ophthalmology | Admitting: Ophthalmology

## 2021-02-04 ENCOUNTER — Ambulatory Visit (HOSPITAL_COMMUNITY): Payer: Medicare Other | Admitting: Anesthesiology

## 2021-02-04 ENCOUNTER — Encounter (HOSPITAL_COMMUNITY): Payer: Self-pay | Admitting: Ophthalmology

## 2021-02-04 ENCOUNTER — Encounter (HOSPITAL_COMMUNITY)
Admission: RE | Disposition: A | Discharge status: Home / Self Care | Payer: Self-pay | Source: Home / Self Care | Attending: Ophthalmology

## 2021-02-04 DIAGNOSIS — Z886 Allergy status to analgesic agent status: Secondary | ICD-10-CM | POA: Diagnosis not present

## 2021-02-04 DIAGNOSIS — H0100A Unspecified blepharitis right eye, upper and lower eyelids: Secondary | ICD-10-CM | POA: Diagnosis not present

## 2021-02-04 DIAGNOSIS — H2511 Age-related nuclear cataract, right eye: Secondary | ICD-10-CM | POA: Insufficient documentation

## 2021-02-04 DIAGNOSIS — Z79899 Other long term (current) drug therapy: Secondary | ICD-10-CM | POA: Insufficient documentation

## 2021-02-04 DIAGNOSIS — H0100B Unspecified blepharitis left eye, upper and lower eyelids: Secondary | ICD-10-CM | POA: Insufficient documentation

## 2021-02-04 DIAGNOSIS — H25811 Combined forms of age-related cataract, right eye: Secondary | ICD-10-CM | POA: Diagnosis not present

## 2021-02-04 DIAGNOSIS — H2181 Floppy iris syndrome: Secondary | ICD-10-CM | POA: Diagnosis not present

## 2021-02-04 DIAGNOSIS — I4510 Unspecified right bundle-branch block: Secondary | ICD-10-CM | POA: Diagnosis not present

## 2021-02-04 HISTORY — PX: CATARACT EXTRACTION W/PHACO: SHX586

## 2021-02-04 SURGERY — PHACOEMULSIFICATION, CATARACT, WITH IOL INSERTION
Anesthesia: Monitor Anesthesia Care | Site: Eye | Laterality: Right

## 2021-02-04 MED ORDER — PHENYLEPHRINE HCL 2.5 % OP SOLN
1.0000 [drp] | OPHTHALMIC | Status: AC | PRN
  Administered 2021-02-04 (×3): 1 [drp] via OPHTHALMIC

## 2021-02-04 MED ORDER — LIDOCAINE HCL (PF) 1 % IJ SOLN
INTRAOCULAR | Status: DC | PRN
  Administered 2021-02-04: 1 mL via OPHTHALMIC

## 2021-02-04 MED ORDER — EPINEPHRINE PF 1 MG/ML IJ SOLN
INTRAMUSCULAR | Status: AC
  Filled 2021-02-04: qty 1

## 2021-02-04 MED ORDER — SODIUM HYALURONATE 10 MG/ML IO SOLUTION
PREFILLED_SYRINGE | INTRAOCULAR | Status: DC | PRN
  Administered 2021-02-04: 0.85 mL via INTRAOCULAR

## 2021-02-04 MED ORDER — TROPICAMIDE 1 % OP SOLN
1.0000 [drp] | OPHTHALMIC | Status: AC
  Administered 2021-02-04 (×3): 1 [drp] via OPHTHALMIC

## 2021-02-04 MED ORDER — POVIDONE-IODINE 5 % OP SOLN
OPHTHALMIC | Status: DC | PRN
  Administered 2021-02-04: 1 via OPHTHALMIC

## 2021-02-04 MED ORDER — BSS IO SOLN
INTRAOCULAR | Status: DC | PRN
  Administered 2021-02-04: 15 mL via INTRAOCULAR

## 2021-02-04 MED ORDER — NEOMYCIN-POLYMYXIN-DEXAMETH 3.5-10000-0.1 OP SUSP
OPHTHALMIC | Status: DC | PRN
  Administered 2021-02-04: 1 [drp] via OPHTHALMIC

## 2021-02-04 MED ORDER — STERILE WATER FOR IRRIGATION IR SOLN
Status: DC | PRN
  Administered 2021-02-04: 250 mL

## 2021-02-04 MED ORDER — PHENYLEPHRINE-KETOROLAC 1-0.3 % IO SOLN
INTRAOCULAR | Status: DC | PRN
  Administered 2021-02-04: 500 mL via OPHTHALMIC

## 2021-02-04 MED ORDER — TETRACAINE HCL 0.5 % OP SOLN
1.0000 [drp] | OPHTHALMIC | Status: AC | PRN
  Administered 2021-02-04 (×3): 1 [drp] via OPHTHALMIC

## 2021-02-04 MED ORDER — LIDOCAINE HCL 3.5 % OP GEL
1.0000 "application " | Freq: Once | OPHTHALMIC | Status: AC
  Administered 2021-02-04: 1 via OPHTHALMIC

## 2021-02-04 MED ORDER — SODIUM HYALURONATE 23MG/ML IO SOSY
PREFILLED_SYRINGE | INTRAOCULAR | Status: DC | PRN
  Administered 2021-02-04: 0.6 mL via INTRAOCULAR

## 2021-02-04 MED ORDER — PHENYLEPHRINE-KETOROLAC 1-0.3 % IO SOLN
INTRAOCULAR | Status: AC
  Filled 2021-02-04: qty 4

## 2021-02-04 SURGICAL SUPPLY — 12 items
CLOTH BEACON ORANGE TIMEOUT ST (SAFETY) ×2 IMPLANT
EYE SHIELD UNIVERSAL CLEAR (GAUZE/BANDAGES/DRESSINGS) ×2 IMPLANT
GLOVE SURG UNDER POLY LF SZ6.5 (GLOVE) ×2 IMPLANT
GLOVE SURG UNDER POLY LF SZ7 (GLOVE) ×2 IMPLANT
NEEDLE HYPO 18GX1.5 BLUNT FILL (NEEDLE) ×2 IMPLANT
PAD ARMBOARD 7.5X6 YLW CONV (MISCELLANEOUS) ×2 IMPLANT
RING MALYGIN 7.0 (MISCELLANEOUS) IMPLANT
SYR TB 1ML LL NO SAFETY (SYRINGE) ×2 IMPLANT
TAPE SURG TRANSPORE 1 IN (GAUZE/BANDAGES/DRESSINGS) ×1 IMPLANT
TAPE SURGICAL TRANSPORE 1 IN (GAUZE/BANDAGES/DRESSINGS) ×1
TECNIS 1-PIECE IOL (Intraocular Lens) ×2 IMPLANT
WATER STERILE IRR 250ML POUR (IV SOLUTION) ×2 IMPLANT

## 2021-02-04 NOTE — Discharge Instructions (Addendum)
Please discharge patient when stable, will follow up today with Dr. Wrzosek at the [facility] office immediately following discharge.  Leave shield in place until visit.  All paperwork with discharge instructions will be given at the office.  [facility] Address:  [address]  [place], [place] [address]  

## 2021-02-04 NOTE — Interval H&P Note (Signed)
History and Physical Interval Note:  02/04/2021 9:34 AM  Veronica Howard  has presented today for surgery, with the diagnosis of nuclear cataract right eye.  The various methods of treatment have been discussed with the patient and family. After consideration of risks, benefits and other options for treatment, the patient has consented to  Procedure(s) with comments: CATARACT EXTRACTION PHACO AND INTRAOCULAR LENS PLACEMENT (IOC) (Right) - right as a surgical intervention.  The patient's history has been reviewed, patient examined, no change in status, stable for surgery.  I have reviewed the patient's chart and labs.  Questions were answered to the patient's satisfaction.     Baruch Goldmann

## 2021-02-04 NOTE — Anesthesia Procedure Notes (Signed)
Procedure Name: MAC Date/Time: 02/04/2021 9:45 AM Performed by: Lieutenant Diego, CRNA Pre-anesthesia Checklist: Patient identified, Emergency Drugs available, Suction available, Patient being monitored and Timeout performed Patient Re-evaluated:Patient Re-evaluated prior to induction Oxygen Delivery Method: Nasal cannula Preoxygenation: Pre-oxygenation with 100% oxygen

## 2021-02-04 NOTE — Anesthesia Postprocedure Evaluation (Signed)
Anesthesia Post Note  Patient: Veronica Howard  Procedure(s) Performed: CATARACT EXTRACTION PHACO AND INTRAOCULAR LENS PLACEMENT (IOC) (Right: Eye)  Patient location during evaluation: Phase II Anesthesia Type: MAC Level of consciousness: awake and alert and oriented Pain management: pain level controlled Vital Signs Assessment: post-procedure vital signs reviewed and stable Respiratory status: spontaneous breathing and respiratory function stable Cardiovascular status: blood pressure returned to baseline and stable Postop Assessment: no apparent nausea or vomiting Anesthetic complications: no   No notable events documented.   Last Vitals:  Vitals:   02/04/21 0857 02/04/21 1006  BP: (!) 150/91 140/73  Pulse: 65 70  Resp: 17 17  Temp: 36.6 C (!) 36.4 C  SpO2: 100% 100%    Last Pain:  Vitals:   02/04/21 1006  TempSrc: Oral  PainSc: 0-No pain                 Poppi Scantling C Jarold Macomber

## 2021-02-04 NOTE — Transfer of Care (Signed)
Immediate Anesthesia Transfer of Care Note  Patient: Veronica Howard  Procedure(s) Performed: CATARACT EXTRACTION PHACO AND INTRAOCULAR LENS PLACEMENT (IOC) (Right: Eye)  Patient Location: PACU  Anesthesia Type:MAC  Level of Consciousness: awake  Airway & Oxygen Therapy: Patient Spontanous Breathing  Post-op Assessment: Report given to RN  Post vital signs: Reviewed and stable  Last Vitals:  Vitals Value Taken Time  BP    Temp    Pulse    Resp    SpO2      Last Pain:  Vitals:   02/04/21 0857  TempSrc: Oral  PainSc: 0-No pain      Patients Stated Pain Goal: Other (Comment) (98/47/30 [id number])  Complications: No notable events documented.

## 2021-02-04 NOTE — Op Note (Signed)
Date of procedure: 02/04/21  Pre-operative diagnosis: Visually significant nuclear cataract, Right Eye; Poor Dilation, Right Eye (H25.21; H21.81)  Post-operative diagnosis: Visually significant nuclear cataract, Right Eye; Intra-operative Floppy Iris Syndrome, Right Eye  Procedure: Removal of cataract via phacoemulsification and insertion of intra-ocular lens [employer] DCB00 +24.0D into the capsular bag of the Right Eye (CPT [phone])  Attending surgeon: Gerda Diss. Nirali Magouirk, MD, MA  Anesthesia: MAC, Topical Akten  Complications: None  Estimated Blood Loss: <50m (minimal)  Specimens: None  Implants: As above  Indications:  Visually significant cataract, Right Eye  Procedure:  The patient was seen and identified in the pre-operative area. The operative eye was identified and dilated.  The operative eye was marked.  Topical anesthesia was administered to the operative eye.     The patient was then to the operative suite and placed in the supine position.  A timeout was performed confirming the patient, procedure to be performed, and all other relevant information.   The patient's face was prepped and draped in the usual fashion for intra-ocular surgery.  A lid speculum was placed into the operative eye and the surgical microscope moved into place and focused.  Poor dilation of the iris was confirmed.  A superotemporal paracentesis was created using a 20 gauge paracentesis blade.  Shugarcaine was injected into the anterior chamber.  Viscoelastic was injected into the anterior chamber.  A temporal clear-corneal main wound incision was created using a 2.432mmicrokeratome.  A Malyugin ring was placed.  A continuous curvilinear capsulorrhexis was initiated using an irrigating cystitome and completed using capsulorrhexis forceps.  Hydrodissection and hydrodeliniation were performed.  Viscoelastic was injected into the anterior chamber.  A phacoemulsification handpiece and a chopper as a second  instrument were used to remove the nucleus and epinucleus. The irrigation/aspiration handpiece was used to remove any remaining cortical material.   The capsular bag was reinflated with viscoelastic, checked, and found to be intact.  The intraocular lens was inserted into the capsular bag and dialed into place using a MaSurveyor, minerals The Malyugin ring was removed.  The irrigation/aspiration handpiece was used to remove any remaining viscoelastic.  The clear corneal wound and paracentesis wounds were then hydrated and checked with Weck-Cels to be watertight.  The lid-speculum and drape was removed, and the patient's face was cleaned with a wet and dry 4x4.  Maxitrol was instilled in the eye before a clear shield was taped over the eye. The patient was taken to the post-operative care unit in good condition, having tolerated the procedure well.  Post-Op Instructions: [facility] and will receive all other orders and instructions.

## 2021-02-04 NOTE — Anesthesia Preprocedure Evaluation (Addendum)
Anesthesia Evaluation  Patient identified by MRN, date of birth, ID band Patient awake    Reviewed: Allergy & Precautions, NPO status , Patient's Chart, lab work & pertinent test results, reviewed documented beta blocker date and time   History of Anesthesia Complications Negative for: history of anesthetic complications  Airway Mallampati: II  TM Distance: >3 FB Neck ROM: Full    Dental  (+) Lower Dentures, Upper Dentures   Pulmonary neg pulmonary ROS,    Pulmonary exam normal breath sounds clear to auscultation       Cardiovascular Exercise Tolerance: Good hypertension, Pt. on medications and Pt. on home beta blockers Normal cardiovascular exam Rhythm:Regular Rate:Normal  18-Sep-2016 15:11:43 [facility] ROUTINE RECORD Normal sinus rhythm Left axis deviation Incomplete right bundle branch block Anteroseptal infarct , age undetermined Abnormal ECG Confirmed by Asencion Noble [phone]) on 09/24/2016 12:00:59 PM   Neuro/Psych negative neurological ROS  negative psych ROS   GI/Hepatic negative GI ROS, Neg liver ROS,   Endo/Other  negative endocrine ROS  Renal/GU negative Renal ROS     Musculoskeletal  (+) Arthritis , Osteoarthritis and Rheumatoid disorders,    Abdominal   Peds  Hematology negative hematology ROS (+)   Anesthesia Other Findings   Reproductive/Obstetrics negative OB ROS                            Anesthesia Physical Anesthesia Plan  ASA: 3  Anesthesia Plan: MAC   Post-op Pain Management:    Induction:   PONV Risk Score and Plan:   Airway Management Planned: Nasal Cannula and Natural Airway  Additional Equipment:   Intra-op Plan:   Post-operative Plan:   Informed Consent: I have reviewed the patients History and Physical, chart, labs and discussed the procedure including the risks, benefits and alternatives for the proposed anesthesia with the  patient or authorized representative who has indicated his/her understanding and acceptance.     Dental advisory given  Plan Discussed with: CRNA and Surgeon  Anesthesia Plan Comments:        Anesthesia Quick Evaluation

## 2021-02-07 ENCOUNTER — Encounter (HOSPITAL_COMMUNITY): Payer: Self-pay | Admitting: Ophthalmology

## 2021-02-22 ENCOUNTER — Encounter (INDEPENDENT_AMBULATORY_CARE_PROVIDER_SITE_OTHER): Payer: Self-pay | Admitting: Gastroenterology

## 2021-02-22 ENCOUNTER — Ambulatory Visit (INDEPENDENT_AMBULATORY_CARE_PROVIDER_SITE_OTHER): Payer: Medicare Other | Admitting: Gastroenterology

## 2021-02-22 ENCOUNTER — Other Ambulatory Visit: Payer: Self-pay

## 2021-02-22 VITALS — BP 172/81 | HR 71 | Temp 98.3°F | Ht 66.0 in | Wt 142.8 lb

## 2021-02-22 DIAGNOSIS — D649 Anemia, unspecified: Secondary | ICD-10-CM | POA: Diagnosis not present

## 2021-02-22 NOTE — Progress Notes (Addendum)
Referring Provider: Neale Burly, MD Primary Care Physician:  Neale Burly, MD Primary GI Physician: newly established, [facility]  Chief Complaint  Patient presents with   Anemia    Patient here today due to anemia and need for a screening Tcs. She has decreased appetite. She has some shortness of breath at times and fatigue. No dizziness. Last hgb from 09/2020 was 10.2   HPI:   Veronica Howard is a 80 y.o. female with past medical history of arthritis and HTN who presents today at the recommendation of her PCP Dr. Sherrie Sport for ongoing anemia.   Anemia: last hgb 10.2 in May 2022. This appears to be a chronic issue as last hgb available for review in EMR from 2018 was 10.6. she also reports long history of IDA, she has taken iron pills in the past, denies any history of receiving IV iron, she is not currently on any iron supplementation, no Iron studies available for review.    She denies melena or BRBPR. She does endorse some fatigue for the past few months, unable to pinpoint exactly when this started, she denies dizziness but endorses some sob. She reports multiple colonoscopies in the past, unsure of when the last one was or who performed it but states it was done at [facility], she denies any abnormalities reported to her from any previous colonoscopy. States she did a cologuard a few years ago but reports she was not given any results of it. Per paper records sent from PCP, appears patient had negative FOBT 3 years ago.   She denies any issues with early satiety or decreased appetite, no post prandial abdominal pain, nausea or vomiting, diarrhea or constipation. Has 1-2 normal BMs per day. She denies NSAID use.   No red flag symptoms. Patient denies melena, hematochezia, nausea, vomiting, diarrhea, constipation, dysphagia, odyonophagia, early satiety or weight loss.   Family hx: no crc or hepatic issues. Social hx: no etoh, tobacco or illicit drug use  Last Colonoscopy:per  patient, multiple in the past, unsure of last one, no records available for review Last Endoscopy:n/a  Recommendations:  colonoscopy  Past Medical History:  Diagnosis Date   Arthritis    Hypertension    Past Surgical History:  Procedure Laterality Date   CATARACT EXTRACTION W/PHACO Left 09/22/2016   Procedure: CATARACT EXTRACTION PHACO AND INTRAOCULAR LENS PLACEMENT LEFT EYE CDE= 13.37;  Surgeon: Baruch Goldmann, MD;  Location: [facility];  Service: Ophthalmology;  Laterality: Left;  left   CATARACT EXTRACTION W/PHACO Right 02/04/2021   Procedure: CATARACT EXTRACTION PHACO AND INTRAOCULAR LENS PLACEMENT (IOC);  Surgeon: Baruch Goldmann, MD;  Location: [facility];  Service: Ophthalmology;  Laterality: Right;  CDE 21.63   ELBOW ARTHROPLASTY Right 2006   REPLACEMENT TOTAL KNEE Right 2013   REPLACEMENT TOTAL KNEE Left 2014    Current Outpatient Medications  Medication Sig Dispense Refill   alendronate (FOSAMAX) 70 MG tablet Take 70 mg by mouth every Monday.     CALCIUM-VITAMIN D PO Take 1 tablet by mouth daily.     gabapentin (NEURONTIN) 100 MG capsule Take 100 mg by mouth 3 (three) times daily.     hydroxychloroquine (PLAQUENIL) 200 MG tablet Take 200 mg by mouth 2 (two) times daily.     labetalol (NORMODYNE) 300 MG tablet Take 300 mg by mouth 2 (two) times daily.     oxyCODONE-acetaminophen (PERCOCET/ROXICET) 5-325 MG tablet Take 1 tablet by mouth every 8 (eight) hours as needed for pain.  potassium chloride SA (KLOR-CON) 20 MEQ tablet Take 20 mEq by mouth daily.     Vitamin D, Ergocalciferol, (DRISDOL) 50000 units CAPS capsule Take 50,000 Units by mouth every Tuesday.     amLODipine (NORVASC) 10 MG tablet Take 10 mg by mouth daily. (Patient not taking: Reported on 02/22/2021)     lisinopril-hydrochlorothiazide (PRINZIDE,ZESTORETIC) 20-25 MG tablet Take 1 tablet by mouth daily. (Patient not taking: Reported on 02/22/2021)     No current facility-administered medications for this visit.     Allergies as of 02/22/2021 - Review Complete 02/22/2021  Allergen Reaction Noted   Tylenol [acetaminophen] Other (See Comments) 09/18/2016    Family History  Problem Relation Age of Onset   Arthritis Father     Social History   Socioeconomic History   Marital status: Widowed    Spouse name: Not on file   Number of children: Not on file   Years of education: Not on file   Highest education level: Not on file  Occupational History   Not on file  Tobacco Use   Smoking status: Never   Smokeless tobacco: Never  Vaping Use   Vaping Use: Never used  Substance and Sexual Activity   Alcohol use: No   Drug use: No   Sexual activity: Yes  Other Topics Concern   Not on file  Social History Narrative   Not on file   Social Determinants of Health   Financial Resource Strain: Not on file  Food Insecurity: Not on file  Transportation Needs: Not on file  Physical Activity: Not on file  Stress: Not on file  Social Connections: Not on file   Review of Systems: Gen: Denies fever, chills, anorexia. Denies weight loss. +fatigue CV: Denies chest pain, palpitations, syncope, peripheral edema, and claudication. Resp: Denies cough, wheezing, coughing up blood, and pleurisy. +sob GI: Denies melena, hematochezia, nausea, vomiting, diarrhea, constipation, dysphagia, odyonophagia, early satiety or weight loss.  Derm: Denies rash, itching, dry skin Psych: Denies depression, anxiety, memory loss, confusion. No homicidal or suicidal ideation.  Heme: Denies bruising, bleeding, and enlarged lymph nodes.  Physical Exam: BP (!) 172/81 (BP Location: Left Arm, Patient Position: Sitting, Cuff Size: Large)   Pulse 71   Temp 98.3 F (36.8 C) (Oral)   Ht 5\' 6"  (1.676 m)   Wt 142 lb 12.8 oz (64.8 kg)   BMI 23.05 kg/m  General:   Alert and oriented. No distress noted. Pleasant and cooperative.  Head:  Normocephalic and atraumatic. Eyes:  Conjuctiva clear without scleral icterus. Mouth:  Oral  mucosa pink and moist. Good dentition. No lesions. Heart: Normal rate and rhythm, s1 and s2 heart sounds present.  Lungs: Clear lung sounds in all lobes. Respirations equal and unlabored. Abdomen:  +BS, soft, non-tender and non-distended. No rebound or guarding. No HSM or masses noted. Derm: No palmar erythema or jaundice, pallor present Msk:  Symmetrical without gross deformities. Normal posture. Extremities:  Without edema. Neurologic:  Alert and  oriented x4 Psych:  Alert and cooperative. Normal mood and affect.  Invalid input(s): 6 MONTHS   ASSESSMENT: Veronica Howard is a 80 y.o. female presenting today at the request of her PCP Dr. Sherrie Sport for anemia.  Hgb in May 2022 was 10.2, patient reports long history of IDA for which she previously took iron pills for, no hx of Iron infusions. She is not currently on any iron supplements. She endorses some fatigue and sob for the past few months, although unsure exactly when this began.  She denies melena or hematochezia. I discussed indications of proceeding with a colonoscopy/EGD at this time for further evaluation of her anemia, however, patient reports she has had multiple colonoscopies in the past (will try to obtain these records from [facility], as we do not have any available for review) and due to her age she is not amenable to proceeding with procedures at this time. Patient was made aware that a colonoscopy/EGD is the most reliable procedure to evaluate for causes of anemia and that our recommendation is ultimately proceeding with a colonoscopy/EGD. I did discuss starting with cologuard which can detect DNA and blood in the stools, however, patient made aware that this test is not 100% reliable and can provide false positives and false negatives, however, a positive result would need to be followed up with a colonoscopy/EGD to rule out malignancy vs pre cancerous polyps, or other sources of blood loss in her GI tract, patient verbalized  understanding and wishes to proceed with cologuard at this time.  We will go ahead and proceed with cologuard at this time as well as recheck CBC and iron studies.   No red flag symptoms. Patient denies melena, hematochezia, nausea, vomiting, diarrhea, constipation, dysphagia, odyonophagia, early satiety or weight loss.   PLAN:  Check CBC, Iron studies 2. cologuard 3. Will need to proceed with colonoscopy/EGD if cologuard is + 4. Patient to make [place] ware if she has worsening fatigue, sob, black stools or BRBPR.    Follow Up: 3 months  Tim Wilhide L. Alver Sorrow, MSN, APRN, AGNP-C Adult-Gerontology Nurse Practitioner [facility]  **addendum, attempted to obtain colonoscopy records for patient, however, [facility] advised they have no colonoscopy records on file.

## 2021-02-22 NOTE — Patient Instructions (Signed)
We will check your labs again today to evaluate for anemia, we will also check your iron levels to see if you require Iron pills again.  I am recommending a colonoscopy for further evaluation of your anemia, however, since you are hesitant to proceed with colonoscopy at this time, we will do cologuard. If cologuard is positive, it would be very important for [place] to proceed with a colonoscopy. We can discuss this further depending on the results.  Please let us know if you develop worsening fatigue, shortness of breath, black stools or blood in your stools.  Follow up in 3 months

## 2021-02-23 LAB — CBC
HCT: 29 % — ABNORMAL LOW (ref 35.0–45.0)
Hemoglobin: 9.7 g/dL — ABNORMAL LOW (ref 11.7–15.5)
MCH: 30.1 pg (ref 27.0–33.0)
MCHC: 33.4 g/dL (ref 32.0–36.0)
MCV: 90.1 fL (ref 80.0–100.0)
MPV: 11.1 fL (ref 7.5–12.5)
Platelets: 233 10*3/uL (ref 140–400)
RBC: 3.22 10*6/uL — ABNORMAL LOW (ref 3.80–5.10)
RDW: 12.7 % (ref 11.0–15.0)
WBC: 4.8 10*3/uL (ref 3.8–10.8)

## 2021-02-23 LAB — IRON,TIBC AND FERRITIN PANEL
%SAT: 26 % (calc) (ref 16–45)
Ferritin: 375 ng/mL — ABNORMAL HIGH (ref 16–288)
Iron: 70 ug/dL (ref 45–160)
TIBC: 268 mcg/dL (calc) (ref 250–450)

## 2021-03-01 ENCOUNTER — Encounter (INDEPENDENT_AMBULATORY_CARE_PROVIDER_SITE_OTHER): Payer: Self-pay

## 2021-03-01 DIAGNOSIS — Z1211 Encounter for screening for malignant neoplasm of colon: Secondary | ICD-10-CM | POA: Insufficient documentation

## 2021-03-15 DIAGNOSIS — Z961 Presence of intraocular lens: Secondary | ICD-10-CM | POA: Diagnosis not present

## 2021-03-22 DIAGNOSIS — M545 Low back pain, unspecified: Secondary | ICD-10-CM | POA: Diagnosis not present

## 2021-03-22 DIAGNOSIS — I10 Essential (primary) hypertension: Secondary | ICD-10-CM | POA: Diagnosis not present

## 2021-03-22 DIAGNOSIS — M0689 Other specified rheumatoid arthritis, multiple sites: Secondary | ICD-10-CM | POA: Diagnosis not present

## 2021-03-22 DIAGNOSIS — E611 Iron deficiency: Secondary | ICD-10-CM | POA: Diagnosis not present

## 2021-03-22 DIAGNOSIS — M818 Other osteoporosis without current pathological fracture: Secondary | ICD-10-CM | POA: Diagnosis not present

## 2021-04-04 ENCOUNTER — Encounter (INDEPENDENT_AMBULATORY_CARE_PROVIDER_SITE_OTHER): Payer: Self-pay

## 2021-05-09 ENCOUNTER — Encounter (INDEPENDENT_AMBULATORY_CARE_PROVIDER_SITE_OTHER): Payer: Self-pay | Admitting: *Deleted

## 2021-06-16 ENCOUNTER — Ambulatory Visit (INDEPENDENT_AMBULATORY_CARE_PROVIDER_SITE_OTHER): Payer: Medicare Other | Admitting: Gastroenterology

## 2021-06-22 DIAGNOSIS — M818 Other osteoporosis without current pathological fracture: Secondary | ICD-10-CM | POA: Diagnosis not present

## 2021-06-22 DIAGNOSIS — I10 Essential (primary) hypertension: Secondary | ICD-10-CM | POA: Diagnosis not present

## 2021-06-22 DIAGNOSIS — Z0000 Encounter for general adult medical examination without abnormal findings: Secondary | ICD-10-CM | POA: Diagnosis not present

## 2021-06-22 DIAGNOSIS — E611 Iron deficiency: Secondary | ICD-10-CM | POA: Diagnosis not present

## 2021-06-22 DIAGNOSIS — M545 Low back pain, unspecified: Secondary | ICD-10-CM | POA: Diagnosis not present

## 2021-06-22 DIAGNOSIS — Z6824 Body mass index (BMI) 24.0-24.9, adult: Secondary | ICD-10-CM | POA: Diagnosis not present

## 2021-06-22 DIAGNOSIS — M0689 Other specified rheumatoid arthritis, multiple sites: Secondary | ICD-10-CM | POA: Diagnosis not present

## 2021-07-07 ENCOUNTER — Ambulatory Visit (INDEPENDENT_AMBULATORY_CARE_PROVIDER_SITE_OTHER): Payer: Medicare Other | Admitting: Gastroenterology

## 2021-08-02 ENCOUNTER — Ambulatory Visit (INDEPENDENT_AMBULATORY_CARE_PROVIDER_SITE_OTHER): Payer: Medicare Other | Admitting: Gastroenterology

## 2021-08-15 ENCOUNTER — Other Ambulatory Visit (INDEPENDENT_AMBULATORY_CARE_PROVIDER_SITE_OTHER): Payer: Self-pay

## 2021-08-15 ENCOUNTER — Other Ambulatory Visit: Payer: Self-pay

## 2021-08-15 ENCOUNTER — Encounter (INDEPENDENT_AMBULATORY_CARE_PROVIDER_SITE_OTHER): Payer: Self-pay | Admitting: Gastroenterology

## 2021-08-15 ENCOUNTER — Ambulatory Visit (INDEPENDENT_AMBULATORY_CARE_PROVIDER_SITE_OTHER): Payer: Medicare Other | Admitting: Gastroenterology

## 2021-08-15 ENCOUNTER — Encounter (INDEPENDENT_AMBULATORY_CARE_PROVIDER_SITE_OTHER): Payer: Self-pay

## 2021-08-15 VITALS — BP 115/71 | HR 69 | Temp 98.3°F | Ht 66.0 in | Wt 142.2 lb

## 2021-08-15 DIAGNOSIS — I10 Essential (primary) hypertension: Secondary | ICD-10-CM

## 2021-08-15 DIAGNOSIS — D649 Anemia, unspecified: Secondary | ICD-10-CM | POA: Diagnosis not present

## 2021-08-15 NOTE — Progress Notes (Signed)
[name], M.D. ?Gastroenterology & Hepatology ?[facility] ?[address] ?[place], [place] [address] ? ?Primary Care Physician: ?Neale Burly, MD ?[address] ?[place] [address] ? ?I will communicate my assessment and recommendations to the referring MD via EMR. ? ?Problems: ?Anemia ? ?History of Present Illness: ?Veronica Howard is a 81 y.o. female with PMH rheumatoid arthritis, hypertension, neuropathy, chronic anemia, who presents for follow up of chronic anemia. ? ?The patient was last seen on 02/22/2021. At that time, the patient was ordered blood work-up for evaluation of anemia which showed a hemoglobin of 9.7 with MCV of 90, rest of cell lines were within normal limits, iron stores were normal with increased ferritin of 375, iron was 70 and iron saturation was 26%.  Due to this, the patient was requested to have an EGD and colonoscopy scheduled but she decided to hold off on doing these procedures until she saw her PCP.  She was ordered a Cologuard but she could not perform this as she cannot operate her jar due to her arthritis. ? ?Patient states that she was prescribed iron since the last time she was seen in our office.  This was not prescribed by our office. ? ?Has felt some fatigue but she feels some SOB when she moves. However she states that because of her arthritis, she is very limited in her ambulation.  She denies having any other complaint.  The patient denies having any nausea, vomiting, fever,  hematochezia, melena, hematemesis, abdominal distention, abdominal pain, diarrhea, jaundice, pruritus or weight loss. She is currently taking ferrous sulfate two times a day. ? ?Last EGD: never ?Last Colonoscopy: per patient, multiple in the past, unsure of last one, no records available for review ? ?Past Medical History: ?Past Medical History:  ?Diagnosis Date  ? Arthritis   ? Hypertension   ? ? ?Past Surgical History: ?Past Surgical  History:  ?Procedure Laterality Date  ? CATARACT EXTRACTION W/PHACO Left 09/22/2016  ? Procedure: CATARACT EXTRACTION PHACO AND INTRAOCULAR LENS PLACEMENT LEFT EYE CDE= 13.37;  Surgeon: Baruch Goldmann, MD;  Location: [facility];  Service: Ophthalmology;  Laterality: Left;  left  ? CATARACT EXTRACTION W/PHACO Right 02/04/2021  ? Procedure: CATARACT EXTRACTION PHACO AND INTRAOCULAR LENS PLACEMENT (IOC);  Surgeon: Baruch Goldmann, MD;  Location: [facility];  Service: Ophthalmology;  Laterality: Right;  CDE 21.63  ? ELBOW ARTHROPLASTY Right 2006  ? REPLACEMENT TOTAL KNEE Right 2013  ? REPLACEMENT TOTAL KNEE Left 2014  ? ? ?Family History: ?Family History  ?Problem Relation Age of Onset  ? Arthritis Father   ? ? ?Social History: ?Social History  ? ?Tobacco Use  ?Smoking Status Never  ? Passive exposure: Current  ?Smokeless Tobacco Never  ? ?Social History  ? ?Substance and Sexual Activity  ?Alcohol Use No  ? ?Social History  ? ?Substance and Sexual Activity  ?Drug Use No  ? ? ?Allergies: ?Allergies  ?Allergen Reactions  ? Tylenol [Acetaminophen] Other (See Comments)  ?  Nose bleed  ? ? ?Medications: ?Current Outpatient Medications  ?Medication Sig Dispense Refill  ? alendronate (FOSAMAX) 70 MG tablet Take 70 mg by mouth every Monday.    ? amLODipine (NORVASC) 10 MG tablet Take 10 mg by mouth daily.    ? CALCIUM-VITAMIN D PO Take 1 tablet by mouth daily. 600 mg calcium plus d3 one daily    ? ferrous sulfate 325 (65 FE) MG tablet Take 325 mg by mouth 2 (two) times  daily with a meal.    ? gabapentin (NEURONTIN) 100 MG capsule Take 100 mg by mouth 3 (three) times daily.    ? hydroxychloroquine (PLAQUENIL) 200 MG tablet Take 200 mg by mouth 2 (two) times daily.    ? labetalol (NORMODYNE) 300 MG tablet Take 300 mg by mouth 2 (two) times daily.    ? lisinopril-hydrochlorothiazide (PRINZIDE,ZESTORETIC) 20-25 MG tablet Take 1 tablet by mouth daily.    ? oxyCODONE-acetaminophen (PERCOCET/ROXICET) 5-325 MG tablet Take 1 tablet by mouth every 8  (eight) hours as needed for pain.    ? potassium chloride SA (KLOR-CON) 20 MEQ tablet Take 20 mEq by mouth daily.    ? Vitamin D, Ergocalciferol, (DRISDOL) 50000 units CAPS capsule Take 50,000 Units by mouth every Tuesday.    ? ?No current facility-administered medications for this visit.  ? ? ?Review of Systems: ?GENERAL: negative for malaise, night sweats ?HEENT: No changes in hearing or vision, no nose bleeds or other nasal problems. ?NECK: Negative for lumps, goiter, pain and significant neck swelling ?RESPIRATORY: Negative for cough, wheezing ?CARDIOVASCULAR: Negative for chest pain, leg swelling, palpitations, orthopnea ?GI: SEE HPI ?MUSCULOSKELETAL: Negative for joint pain or swelling, back pain, and muscle pain. ?SKIN: Negative for lesions, rash ?PSYCH: Negative for sleep disturbance, mood disorder and recent psychosocial stressors. ?HEMATOLOGY Negative for prolonged bleeding, bruising easily, and swollen nodes. ?ENDOCRINE: Negative for cold or heat intolerance, polyuria, polydipsia and goiter. ?NEURO: negative for tremor, gait imbalance, syncope and seizures. ?The remainder of the review of systems is noncontributory. ? ? ?Physical Exam: ?BP 115/71 (BP Location: Left Arm, Patient Position: Sitting, Cuff Size: Normal)   Pulse 69   Temp 98.3 ?F (36.8 ?C) (Oral)   Ht '5\' 6"'$  (1.676 m)   Wt 142 lb 3.2 oz (64.5 kg)   BMI 22.95 kg/m?  ?GENERAL: The patient is AO x3, in no acute distress. ?HEENT: Head is normocephalic and atraumatic. EOMI are intact. Mouth is well hydrated and without lesions. ?NECK: Supple. No masses ?LUNGS: Clear to auscultation. No presence of rhonchi/wheezing/rales. Adequate chest expansion ?HEART: RRR, normal s1 and s2. ?ABDOMEN: Soft, nontender, no guarding, no peritoneal signs, and nondistended. BS +. No masses. ?EXTREMITIES: Without any cyanosis, clubbing, rash, lesions or edema. ?NEUROLOGIC: AOx3, no focal motor deficit. ?SKIN: no jaundice, no rashes ? ?Imaging/Labs: ?as above ? ?I  personally reviewed and interpreted the available labs, imaging and endoscopic files. ? ?Impression and Plan: ?Veronica Howard is a 81 y.o. female with PMH rheumatoid arthritis, hypertension, neuropathy, chronic anemia, who presents for follow up of chronic anemia.  Patient has not presented any over less intestinal main symptoms and had recent blood work-up that showed normal iron stores.  I do not consider she is having any ongoing gastrointestinal losses related to her anemia but we will proceed with a colonoscopy as it is unclear if she had one performed recently and she could not proceed with Cologuard.  She is agreeable to proceed with this procedure.  As she did not have any evidence of iron deficiency, she can stop taking oral iron.  I will refer her to hematology for further evaluation of her anemia. ? ?- Schedule colonoscopy ?- Referral to hematology ?- Can stop oral iron intake ? ?All questions were answered.     ? ?Harvel Quale, MD ?Gastroenterology and Hepatology ?[facility] ? ?

## 2021-08-15 NOTE — H&P (View-Only) (Signed)
[name], M.D. ?Gastroenterology & Hepatology ?[facility] ?[address] ?[place], [place] [address] ? ?Primary Care Physician: ?Neale Burly, MD ?[address] ?[place] [address] ? ?I will communicate my assessment and recommendations to the referring MD via EMR. ? ?Problems: ?Anemia ? ?History of Present Illness: ?Veronica Howard is a 81 y.o. female with PMH rheumatoid arthritis, hypertension, neuropathy, chronic anemia, who presents for follow up of chronic anemia. ? ?The patient was last seen on 02/22/2021. At that time, the patient was ordered blood work-up for evaluation of anemia which showed a hemoglobin of 9.7 with MCV of 90, rest of cell lines were within normal limits, iron stores were normal with increased ferritin of 375, iron was 70 and iron saturation was 26%.  Due to this, the patient was requested to have an EGD and colonoscopy scheduled but she decided to hold off on doing these procedures until she saw her PCP.  She was ordered a Cologuard but she could not perform this as she cannot operate her jar due to her arthritis. ? ?Patient states that she was prescribed iron since the last time she was seen in our office.  This was not prescribed by our office. ? ?Has felt some fatigue but she feels some SOB when she moves. However she states that because of her arthritis, she is very limited in her ambulation.  She denies having any other complaint.  The patient denies having any nausea, vomiting, fever,  hematochezia, melena, hematemesis, abdominal distention, abdominal pain, diarrhea, jaundice, pruritus or weight loss. She is currently taking ferrous sulfate two times a day. ? ?Last EGD: never ?Last Colonoscopy: per patient, multiple in the past, unsure of last one, no records available for review ? ?Past Medical History: ?Past Medical History:  ?Diagnosis Date  ? Arthritis   ? Hypertension   ? ? ?Past Surgical History: ?Past Surgical  History:  ?Procedure Laterality Date  ? CATARACT EXTRACTION W/PHACO Left 09/22/2016  ? Procedure: CATARACT EXTRACTION PHACO AND INTRAOCULAR LENS PLACEMENT LEFT EYE CDE= 13.37;  Surgeon: Baruch Goldmann, MD;  Location: [facility];  Service: Ophthalmology;  Laterality: Left;  left  ? CATARACT EXTRACTION W/PHACO Right 02/04/2021  ? Procedure: CATARACT EXTRACTION PHACO AND INTRAOCULAR LENS PLACEMENT (IOC);  Surgeon: Baruch Goldmann, MD;  Location: [facility];  Service: Ophthalmology;  Laterality: Right;  CDE 21.63  ? ELBOW ARTHROPLASTY Right 2006  ? REPLACEMENT TOTAL KNEE Right 2013  ? REPLACEMENT TOTAL KNEE Left 2014  ? ? ?Family History: ?Family History  ?Problem Relation Age of Onset  ? Arthritis Father   ? ? ?Social History: ?Social History  ? ?Tobacco Use  ?Smoking Status Never  ? Passive exposure: Current  ?Smokeless Tobacco Never  ? ?Social History  ? ?Substance and Sexual Activity  ?Alcohol Use No  ? ?Social History  ? ?Substance and Sexual Activity  ?Drug Use No  ? ? ?Allergies: ?Allergies  ?Allergen Reactions  ? Tylenol [Acetaminophen] Other (See Comments)  ?  Nose bleed  ? ? ?Medications: ?Current Outpatient Medications  ?Medication Sig Dispense Refill  ? alendronate (FOSAMAX) 70 MG tablet Take 70 mg by mouth every Monday.    ? amLODipine (NORVASC) 10 MG tablet Take 10 mg by mouth daily.    ? CALCIUM-VITAMIN D PO Take 1 tablet by mouth daily. 600 mg calcium plus d3 one daily    ? ferrous sulfate 325 (65 FE) MG tablet Take 325 mg by mouth 2 (two) times  daily with a meal.    ? gabapentin (NEURONTIN) 100 MG capsule Take 100 mg by mouth 3 (three) times daily.    ? hydroxychloroquine (PLAQUENIL) 200 MG tablet Take 200 mg by mouth 2 (two) times daily.    ? labetalol (NORMODYNE) 300 MG tablet Take 300 mg by mouth 2 (two) times daily.    ? lisinopril-hydrochlorothiazide (PRINZIDE,ZESTORETIC) 20-25 MG tablet Take 1 tablet by mouth daily.    ? oxyCODONE-acetaminophen (PERCOCET/ROXICET) 5-325 MG tablet Take 1 tablet by mouth every 8  (eight) hours as needed for pain.    ? potassium chloride SA (KLOR-CON) 20 MEQ tablet Take 20 mEq by mouth daily.    ? Vitamin D, Ergocalciferol, (DRISDOL) 50000 units CAPS capsule Take 50,000 Units by mouth every Tuesday.    ? ?No current facility-administered medications for this visit.  ? ? ?Review of Systems: ?GENERAL: negative for malaise, night sweats ?HEENT: No changes in hearing or vision, no nose bleeds or other nasal problems. ?NECK: Negative for lumps, goiter, pain and significant neck swelling ?RESPIRATORY: Negative for cough, wheezing ?CARDIOVASCULAR: Negative for chest pain, leg swelling, palpitations, orthopnea ?GI: SEE HPI ?MUSCULOSKELETAL: Negative for joint pain or swelling, back pain, and muscle pain. ?SKIN: Negative for lesions, rash ?PSYCH: Negative for sleep disturbance, mood disorder and recent psychosocial stressors. ?HEMATOLOGY Negative for prolonged bleeding, bruising easily, and swollen nodes. ?ENDOCRINE: Negative for cold or heat intolerance, polyuria, polydipsia and goiter. ?NEURO: negative for tremor, gait imbalance, syncope and seizures. ?The remainder of the review of systems is noncontributory. ? ? ?Physical Exam: ?BP 115/71 (BP Location: Left Arm, Patient Position: Sitting, Cuff Size: Normal)   Pulse 69   Temp 98.3 ?F (36.8 ?C) (Oral)   Ht '5\' 6"'$  (1.676 m)   Wt 142 lb 3.2 oz (64.5 kg)   BMI 22.95 kg/m?  ?GENERAL: The patient is AO x3, in no acute distress. ?HEENT: Head is normocephalic and atraumatic. EOMI are intact. Mouth is well hydrated and without lesions. ?NECK: Supple. No masses ?LUNGS: Clear to auscultation. No presence of rhonchi/wheezing/rales. Adequate chest expansion ?HEART: RRR, normal s1 and s2. ?ABDOMEN: Soft, nontender, no guarding, no peritoneal signs, and nondistended. BS +. No masses. ?EXTREMITIES: Without any cyanosis, clubbing, rash, lesions or edema. ?NEUROLOGIC: AOx3, no focal motor deficit. ?SKIN: no jaundice, no rashes ? ?Imaging/Labs: ?as above ? ?I  personally reviewed and interpreted the available labs, imaging and endoscopic files. ? ?Impression and Plan: ?DELLE ANDRZEJEWSKI is a 81 y.o. female with PMH rheumatoid arthritis, hypertension, neuropathy, chronic anemia, who presents for follow up of chronic anemia.  Patient has not presented any over less intestinal main symptoms and had recent blood work-up that showed normal iron stores.  I do not consider she is having any ongoing gastrointestinal losses related to her anemia but we will proceed with a colonoscopy as it is unclear if she had one performed recently and she could not proceed with Cologuard.  She is agreeable to proceed with this procedure.  As she did not have any evidence of iron deficiency, she can stop taking oral iron.  I will refer her to hematology for further evaluation of her anemia. ? ?- Schedule colonoscopy ?- Referral to hematology ?- Can stop oral iron intake ? ?All questions were answered.     ? ?Harvel Quale, MD ?Gastroenterology and Hepatology ?[facility] ? ?

## 2021-08-15 NOTE — Patient Instructions (Addendum)
Schedule colonoscopy ?Referral to hematology ?Can stop oral iron intake ?

## 2021-08-17 ENCOUNTER — Other Ambulatory Visit (INDEPENDENT_AMBULATORY_CARE_PROVIDER_SITE_OTHER): Payer: Self-pay | Admitting: Gastroenterology

## 2021-08-17 ENCOUNTER — Encounter (INDEPENDENT_AMBULATORY_CARE_PROVIDER_SITE_OTHER): Payer: Self-pay

## 2021-08-17 DIAGNOSIS — D649 Anemia, unspecified: Secondary | ICD-10-CM

## 2021-08-17 NOTE — Progress Notes (Signed)
Please let the patient know she needs to perform the labs before being referred to hematology per hematology clinic policy ?

## 2021-08-18 NOTE — Progress Notes (Signed)
Patient made aware. I have mailed her lab requisition to her, at her request she is aware she will need this done prior to being referred by Hematology. FYI:Ann I don't know if you need to know this or not. ?

## 2021-08-31 NOTE — Patient Instructions (Signed)
? ? ? ? ? ? Veronica Howard ? 08/31/2021  ?  ? '@PREFPERIOPPHARMACY'$ @ ? ? Your procedure is scheduled on  09/06/2021. ? ? Report to [facility] at  [place]. ? ? Call this number if you have problems the morning of surgery: ? [phone] ? ? Remember: ? Follow the diet and prep instructions given to you by the office. ?  ? Take these medicines the morning of surgery with A SIP OF WATER  ? ?Amlodpine, gabapentin, labetolol, oxycodone(if needed). ?  ? Do not wear jewelry, make-up or nail polish. ? Do not wear lotions, powders, or perfumes, or deodorant. ? Do not shave 48 hours prior to surgery.  Men may shave face and neck. ? Do not bring valuables to the hospital. ? [facility] is not responsible for any belongings or valuables. ? ?Contacts, dentures or bridgework may not be worn into surgery.  Leave your suitcase in the car.  After surgery it may be brought to your room. ? ?For patients admitted to the hospital, discharge time will be determined by your treatment team. ? ?Patients discharged the day of surgery will not be allowed to drive home and must have someone with them for 24 hours.  ? ? ?Special instructions:   DO NOT smoke tobacco or vape for 24 hours before your procedure. ? ?Please read over the following fact sheets that you were given. ?Anesthesia Post-op Instructions and Care and Recovery After Surgery ?  ? ? ? Colonoscopy, Adult, Care After ?This sheet gives you information about how to care for yourself after your procedure. Your health care provider may also give you more specific instructions. If you have problems or questions, contact your health care provider. ?What can I expect after the procedure? ?After the procedure, it is common to have: ?A small amount of blood in your stool for 24 hours after the procedure. ?Some gas. ?Mild cramping or bloating of your abdomen. ?Follow these instructions at home: ?Eating and drinking ? ?Drink enough fluid to keep your urine pale yellow. ?Follow instructions from  your health care provider about eating or drinking restrictions. ?Resume your normal diet as instructed by your health care provider. Avoid heavy or fried foods that are hard to digest. ?Activity ?Rest as told by your health care provider. ?Avoid sitting for a long time without moving. Get up to take short walks every 1-2 hours. This is important to improve blood flow and breathing. Ask for help if you feel weak or unsteady. ?Return to your normal activities as told by your health care provider. Ask your health care provider what activities are safe for you. ?Managing cramping and bloating ? ?Try walking around when you have cramps or feel bloated. ?Apply heat to your abdomen as told by your health care provider. Use the heat source that your health care provider recommends, such as a moist heat pack or a heating pad. ?Place a towel between your skin and the heat source. ?Leave the heat on for 20-30 minutes. ?Remove the heat if your skin turns bright red. This is especially important if you are unable to feel pain, heat, or cold. You may have a greater risk of getting burned. ?General instructions ?If you were given a sedative during the procedure, it can affect you for several hours. Do not drive or operate machinery until your health care provider says that it is safe. ?For the first 24 hours after the procedure: ?Do not sign important documents. ?Do not drink alcohol. ?  Do your regular daily activities at a slower pace than normal. ?Eat soft foods that are easy to digest. ?Take over-the-counter and prescription medicines only as told by your health care provider. ?Keep all follow-up visits as told by your health care provider. This is important. ?Contact a health care provider if: ?You have blood in your stool 2-3 days after the procedure. ?Get help right away if you have: ?More than a small spotting of blood in your stool. ?Large blood clots in your stool. ?Swelling of your abdomen. ?Nausea or vomiting. ?A  fever. ?Increasing pain in your abdomen that is not relieved with medicine. ?Summary ?After the procedure, it is common to have a small amount of blood in your stool. You may also have mild cramping and bloating of your abdomen. ?If you were given a sedative during the procedure, it can affect you for several hours. Do not drive or operate machinery until your health care provider says that it is safe. ?Get help right away if you have a lot of blood in your stool, nausea or vomiting, a fever, or increased pain in your abdomen. ?This information is not intended to replace advice given to you by your health care provider. Make sure you discuss any questions you have with your health care provider. ?Document Revised: 03/14/2019 Document Reviewed: 12/02/2018 ?Elsevier Patient Education ? [place]. ?Monitored Anesthesia Care, Care After ?This sheet gives you information about how to care for yourself after your procedure. Your health care provider may also give you more specific instructions. If you have problems or questions, contact your health care provider. ?What can I expect after the procedure? ?After the procedure, it is common to have: ?Tiredness. ?Forgetfulness about what happened after the procedure. ?Impaired judgment for important decisions. ?Nausea or vomiting. ?Some difficulty with balance. ?Follow these instructions at home: ?For the time period you were told by your health care provider: ?  ?Rest as needed. ?Do not participate in activities where you could fall or become injured. ?Do not drive or use machinery. ?Do not drink alcohol. ?Do not take sleeping pills or medicines that cause drowsiness. ?Do not make important decisions or sign legal documents. ?Do not take care of children on your own. ?Eating and drinking ?Follow the diet that is recommended by your health care provider. ?Drink enough fluid to keep your urine pale yellow. ?If you vomit: ?Drink water, juice, or soup when you can drink  without vomiting. ?Make sure you have little or no nausea before eating solid foods. ?General instructions ?Have a responsible adult stay with you for the time you are told. It is important to have someone help care for you until you are awake and alert. ?Take over-the-counter and prescription medicines only as told by your health care provider. ?If you have sleep apnea, surgery and certain medicines can increase your risk for breathing problems. Follow instructions from your health care provider about wearing your sleep device: ?Anytime you are sleeping, including during daytime naps. ?While taking prescription pain medicines, sleeping medicines, or medicines that make you drowsy. ?Avoid smoking. ?Keep all follow-up visits as told by your health care provider. This is important. ?Contact a health care provider if: ?You keep feeling nauseous or you keep vomiting. ?You feel light-headed. ?You are still sleepy or having trouble with balance after 24 hours. ?You develop a rash. ?You have a fever. ?You have redness or swelling around the IV site. ?Get help right away if: ?You have trouble breathing. ?You have new-onset confusion  at home. ?Summary ?For several hours after your procedure, you may feel tired. You may also be forgetful and have poor judgment. ?Have a responsible adult stay with you for the time you are told. It is important to have someone help care for you until you are awake and alert. ?Rest as told. Do not drive or operate machinery. Do not drink alcohol or take sleeping pills. ?Get help right away if you have trouble breathing, or if you suddenly become confused. ?This information is not intended to replace advice given to you by your health care provider. Make sure you discuss any questions you have with your health care provider. ?Document Revised: 01/22/2020 Document Reviewed: 04/10/2019 ?Elsevier Patient Education ? [place]. ? ?

## 2021-09-02 ENCOUNTER — Other Ambulatory Visit (INDEPENDENT_AMBULATORY_CARE_PROVIDER_SITE_OTHER): Payer: Self-pay | Admitting: Gastroenterology

## 2021-09-02 ENCOUNTER — Encounter (HOSPITAL_COMMUNITY): Payer: Self-pay

## 2021-09-02 ENCOUNTER — Encounter (HOSPITAL_COMMUNITY)
Admission: RE | Admit: 2021-09-02 | Discharge: 2021-09-02 | Disposition: A | Discharge status: Home / Self Care | Payer: Medicare Other | Source: Ambulatory Visit | Attending: Gastroenterology | Admitting: Gastroenterology

## 2021-09-02 VITALS — BP 128/73 | HR 71 | Temp 98.2°F | Resp 16 | Ht 66.0 in | Wt 142.0 lb

## 2021-09-02 DIAGNOSIS — Z01818 Encounter for other preprocedural examination: Secondary | ICD-10-CM | POA: Insufficient documentation

## 2021-09-02 DIAGNOSIS — D649 Anemia, unspecified: Secondary | ICD-10-CM | POA: Diagnosis not present

## 2021-09-02 LAB — VITAMIN B12: Vitamin B-12: 205 pg/mL (ref 180–914)

## 2021-09-02 LAB — CBC
HCT: 30.2 % — ABNORMAL LOW (ref 36.0–46.0)
Hemoglobin: 9.9 g/dL — ABNORMAL LOW (ref 12.0–15.0)
MCH: 29.8 pg (ref 26.0–34.0)
MCHC: 32.8 g/dL (ref 30.0–36.0)
MCV: 91 fL (ref 80.0–100.0)
Platelets: 212 10*3/uL (ref 150–400)
RBC: 3.32 MIL/uL — ABNORMAL LOW (ref 3.87–5.11)
RDW: 12.2 % (ref 11.5–15.5)
WBC: 5.5 10*3/uL (ref 4.0–10.5)
nRBC: 0 % (ref 0.0–0.2)

## 2021-09-02 LAB — IRON AND TIBC
Iron: 50 ug/dL (ref 28–170)
Saturation Ratios: 16 % (ref 10.4–31.8)
TIBC: 310 ug/dL (ref 250–450)
UIBC: 260 ug/dL

## 2021-09-02 LAB — BASIC METABOLIC PANEL
Anion gap: 10 (ref 5–15)
BUN: 21 mg/dL (ref 8–23)
CO2: 22 mmol/L (ref 22–32)
Calcium: 9.9 mg/dL (ref 8.9–10.3)
Chloride: 101 mmol/L (ref 98–111)
Creatinine, Ser: 0.99 mg/dL (ref 0.44–1.00)
GFR, Estimated: 57 mL/min — ABNORMAL LOW (ref >60)
Glucose, Bld: 101 mg/dL — ABNORMAL HIGH (ref 70–99)
Potassium: 3 mmol/L — ABNORMAL LOW (ref 3.5–5.1)
Sodium: 133 mmol/L — ABNORMAL LOW (ref 135–145)

## 2021-09-02 LAB — FERRITIN: Ferritin: 310 ng/mL — ABNORMAL HIGH (ref 11–307)

## 2021-09-02 LAB — FOLATE: Folate: 7 ng/mL (ref >5.9)

## 2021-09-02 MED ORDER — POTASSIUM CHLORIDE CRYS ER 20 MEQ PO TBCR: 40.0000 meq | EXTENDED_RELEASE_TABLET | Freq: Every day | ORAL | 0 refills | Status: DC

## 2021-09-06 ENCOUNTER — Ambulatory Visit (HOSPITAL_COMMUNITY): Payer: Medicare Other | Admitting: Anesthesiology

## 2021-09-06 ENCOUNTER — Encounter (HOSPITAL_COMMUNITY): Payer: Self-pay | Admitting: Gastroenterology

## 2021-09-06 ENCOUNTER — Ambulatory Visit (HOSPITAL_BASED_OUTPATIENT_CLINIC_OR_DEPARTMENT_OTHER): Payer: Medicare Other | Admitting: Anesthesiology

## 2021-09-06 ENCOUNTER — Ambulatory Visit (HOSPITAL_COMMUNITY)
Admission: RE | Admit: 2021-09-06 | Discharge: 2021-09-06 | Disposition: A | Discharge status: Home / Self Care | Payer: Medicare Other | Attending: Gastroenterology | Admitting: Gastroenterology

## 2021-09-06 ENCOUNTER — Encounter (HOSPITAL_COMMUNITY)
Admission: RE | Disposition: A | Discharge status: Home / Self Care | Payer: Self-pay | Source: Home / Self Care | Attending: Gastroenterology

## 2021-09-06 DIAGNOSIS — Z8261 Family history of arthritis: Secondary | ICD-10-CM | POA: Diagnosis not present

## 2021-09-06 DIAGNOSIS — Z79899 Other long term (current) drug therapy: Secondary | ICD-10-CM | POA: Insufficient documentation

## 2021-09-06 DIAGNOSIS — G629 Polyneuropathy, unspecified: Secondary | ICD-10-CM | POA: Insufficient documentation

## 2021-09-06 DIAGNOSIS — D649 Anemia, unspecified: Secondary | ICD-10-CM

## 2021-09-06 DIAGNOSIS — I10 Essential (primary) hypertension: Secondary | ICD-10-CM | POA: Insufficient documentation

## 2021-09-06 DIAGNOSIS — M069 Rheumatoid arthritis, unspecified: Secondary | ICD-10-CM | POA: Insufficient documentation

## 2021-09-06 HISTORY — PX: COLONOSCOPY WITH PROPOFOL: SHX5780

## 2021-09-06 LAB — GLUCOSE, CAPILLARY: Glucose-Capillary: 108 mg/dL — ABNORMAL HIGH (ref 70–99)

## 2021-09-06 LAB — HM COLONOSCOPY

## 2021-09-06 SURGERY — COLONOSCOPY WITH PROPOFOL
Anesthesia: General

## 2021-09-06 MED ORDER — LACTATED RINGERS IV SOLN
INTRAVENOUS | Status: DC
  Administered 2021-09-06: 1000 mL via INTRAVENOUS

## 2021-09-06 MED ORDER — SODIUM CHLORIDE 0.9 % IV SOLN: INTRAVENOUS | Status: DC

## 2021-09-06 MED ORDER — PROPOFOL 10 MG/ML IV BOLUS
INTRAVENOUS | Status: DC | PRN
  Administered 2021-09-06: 40 mg via INTRAVENOUS

## 2021-09-06 MED ORDER — PROPOFOL 500 MG/50ML IV EMUL
INTRAVENOUS | Status: DC | PRN
  Administered 2021-09-06: 100 ug/kg/min via INTRAVENOUS

## 2021-09-06 NOTE — Anesthesia Postprocedure Evaluation (Signed)
Anesthesia Post Note ? ?Patient: Veronica Howard ? ?Procedure(s) Performed: COLONOSCOPY WITH PROPOFOL ? ?Patient location during evaluation: Phase II ?Anesthesia Type: General ?Level of consciousness: awake ?Pain management: pain level controlled ?Vital Signs Assessment: post-procedure vital signs reviewed and stable ?Respiratory status: spontaneous breathing and respiratory function stable ?Cardiovascular status: blood pressure returned to baseline and stable ?Postop Assessment: no headache and no apparent nausea or vomiting ?Anesthetic complications: no ?Comments: Late entry ? ? ?No notable events documented. ? ? ?Last Vitals:  ?Vitals:  ? 09/06/21 0911 09/06/21 1104  ?BP: (!) 154/78 110/77  ?Pulse: 65 75  ?Resp: 18 20  ?Temp: 36.9 ?C 36.7 ?C  ?SpO2: 99% 99%  ?  ?Last Pain:  ?Vitals:  ? 09/06/21 1104  ?TempSrc: Oral  ?PainSc: Asleep  ? ? ?  ?  ?  ?  ?  ?  ? ?Louann Sjogren ? ? ? ? ?

## 2021-09-06 NOTE — Discharge Instructions (Signed)
You are being discharged to home.  ?Resume your previous diet.  ?Your physician has indicated that a repeat colonoscopy is not recommended due to your current age (60 years or older) for screening purposes.  ?Schedule CT scan abdomen and pelvis with IV contrast ?Follow up with hematology. ?

## 2021-09-06 NOTE — Transfer of Care (Signed)
Immediate Anesthesia Transfer of Care Note ? ?Patient: Veronica Howard ? ?Procedure(s) Performed: COLONOSCOPY WITH PROPOFOL ? ?Patient Location: PACU ? ?Anesthesia Type:General ? ?Level of Consciousness: awake, alert , oriented and patient cooperative ? ?Airway & Oxygen Therapy: Patient Spontanous Breathing and Patient connected to nasal cannula oxygen ? ?Post-op Assessment: Report given to RN, Post -op Vital signs reviewed and stable and Patient moving all extremities X 4 ? ?Post vital signs: Reviewed and stable ? ?Last Vitals:  ?Vitals Value Taken Time  ?BP 110/77 09/06/21 1104  ?Temp 36.7 ?C 09/06/21 1104  ?Pulse 75 09/06/21 1104  ?Resp 20 09/06/21 1104  ?SpO2 99 % 09/06/21 1104  ? ? ?Last Pain:  ?Vitals:  ? 09/06/21 1104  ?TempSrc: Oral  ?PainSc: Asleep  ?   ? ?Patients Stated Pain Goal: 5 (09/06/21 0911) ? ?Complications: No notable events documented. ?

## 2021-09-06 NOTE — Interval H&P Note (Signed)
History and Physical Interval Note: ? ?09/06/2021 ?8:42 AM ? ?Veronica Howard  has presented today for surgery, with the diagnosis of Anemia.  The various methods of treatment have been discussed with the patient and family. After consideration of risks, benefits and other options for treatment, the patient has consented to  Procedure(s) with comments: ?COLONOSCOPY WITH PROPOFOL (N/A) - 1020 as a surgical intervention.  The patient's history has been reviewed, patient examined, no change in status, stable for surgery.  I have reviewed the patient's chart and labs.  Questions were answered to the patient's satisfaction.   ? ? ?Veronica Howard ? ? ?

## 2021-09-06 NOTE — Anesthesia Preprocedure Evaluation (Signed)
Anesthesia Evaluation  Patient identified by MRN, date of birth, ID band Patient awake    Reviewed: Allergy & Precautions, H&P , NPO status , Patient's Chart, lab work & pertinent test results, reviewed documented beta blocker date and time   Airway Mallampati: II  TM Distance: >3 FB Neck ROM: full    Dental no notable dental hx.    Pulmonary neg pulmonary ROS,    Pulmonary exam normal breath sounds clear to auscultation       Cardiovascular Exercise Tolerance: Good hypertension, negative cardio ROS   Rhythm:regular Rate:Normal     Neuro/Psych negative neurological ROS  negative psych ROS   GI/Hepatic negative GI ROS, Neg liver ROS,   Endo/Other  negative endocrine ROS  Renal/GU negative Renal ROS  negative genitourinary   Musculoskeletal   Abdominal   Peds  Hematology  (+) Blood dyscrasia, anemia ,   Anesthesia Other Findings   Reproductive/Obstetrics negative OB ROS                             Anesthesia Physical Anesthesia Plan  ASA: 2  Anesthesia Plan: General   Post-op Pain Management:    Induction:   PONV Risk Score and Plan: Propofol infusion  Airway Management Planned:   Additional Equipment:   Intra-op Plan:   Post-operative Plan:   Informed Consent: I have reviewed the patients History and Physical, chart, labs and discussed the procedure including the risks, benefits and alternatives for the proposed anesthesia with the patient or authorized representative who has indicated his/her understanding and acceptance.     Dental Advisory Given  Plan Discussed with: CRNA  Anesthesia Plan Comments:         Anesthesia Quick Evaluation  

## 2021-09-06 NOTE — Op Note (Signed)
[facility] ?Patient Name: Veronica Howard ?Procedure Date: 09/06/2021 10:15 AM ?MRN: [id number] ?Date of Birth: December 09, 1940 ?Attending MD: [name] ,  ?CSN: [id number] ?Age: 81 ?Admit Type: Outpatient ?Procedure:                Colonoscopy ?Indications:              Anemia ?Providers:                [name], Janeece Riggers, RN, Suzan Garibaldi.  ?                          Risa Grill, Technician ?Referring MD:              ?Medicines:                Monitored Anesthesia Care ?Complications:            No immediate complications. ?Estimated Blood Loss:     Estimated blood loss: none. ?Procedure:                Pre-Anesthesia Assessment: ?                          - Prior to the procedure, a History and Physical  ?                          was performed, and patient medications, allergies  ?                          and sensitivities were reviewed. The patient's  ?                          tolerance of previous anesthesia was reviewed. ?                          - The risks and benefits of the procedure and the  ?                          sedation options and risks were discussed with the  ?                          patient. All questions were answered and informed  ?                          consent was obtained. ?                          - ASA Grade Assessment: II - A patient with mild  ?                          systemic disease. ?                          After obtaining informed consent, the colonoscope  ?                          was passed under direct vision. Throughout the  ?  procedure, the patient's blood pressure, pulse, and  ?                          oxygen saturations were monitored continuously. The  ?                          [id number]-HQ190L ([id number]) scope was introduced through  ?                          the anus and advanced to the the terminal ileum.  ?                          The colonoscopy was performed without difficulty.  ?                          The patient  tolerated the procedure well. The  ?                          quality of the bowel preparation was excellent. ?Scope In: 10:40:25 AM ?Scope Out: 11:01:06 AM ?Scope Withdrawal Time: 0 hours 13 minutes 50 seconds  ?Total Procedure Duration: 0 hours 20 minutes 41 seconds  ?Findings: ?     The perianal and digital rectal examinations were normal. ?     The terminal ileum appeared normal. ?     Narrowed lumen in the distal rectum, likely due to external compression  ?     (no mucosal involvement). ?     The retroflexed view of the distal rectum and anal verge was normal and  ?     showed no anal or rectal abnormalities. ?Impression:               - The examined portion of the ileum was normal. ?                          - External compression in the rectum. ?                          - The distal rectum and anal verge are normal on  ?                          retroflexion view. ?                          - No specimens collected. ?Moderate Sedation: ?     Per Anesthesia Care ?Recommendation:           - Discharge patient to home (ambulatory). ?                          - Resume previous diet. ?                          - Repeat colonoscopy is not recommended due to  ?                          current age (25 years or older) for screening  ?  purposes. ?                          - Schedule CT scan abdomen and pelvis with IV  ?                          contrast ?Procedure Code(s):        --- Professional --- ?                          [phone], Colonoscopy, flexible; diagnostic, including  ?                          collection of specimen(s) by brushing or washing,  ?                          when performed (separate procedure) ?Diagnosis Code(s):        --- Professional --- ?                          D64.9, Anemia, unspecified ?CPT copyright 2019 American Medical Association. All rights reserved. ?The codes documented in this report are preliminary and upon coder review may  ?be revised to meet  current compliance requirements. ?[name], MD ?[name],  ?09/06/2021 11:16:05 AM ?This report has been signed electronically. ?Number of Addenda: 0 ?

## 2021-09-07 ENCOUNTER — Other Ambulatory Visit (INDEPENDENT_AMBULATORY_CARE_PROVIDER_SITE_OTHER): Payer: Self-pay

## 2021-09-07 ENCOUNTER — Telehealth (INDEPENDENT_AMBULATORY_CARE_PROVIDER_SITE_OTHER): Payer: Self-pay

## 2021-09-07 ENCOUNTER — Encounter (INDEPENDENT_AMBULATORY_CARE_PROVIDER_SITE_OTHER): Payer: Self-pay | Admitting: *Deleted

## 2021-09-07 DIAGNOSIS — K6289 Other specified diseases of anus and rectum: Secondary | ICD-10-CM

## 2021-09-07 NOTE — Telephone Encounter (Signed)
Thanks

## 2021-09-07 NOTE — Telephone Encounter (Signed)
Ct abd/pel w/cm is scheduled at [facility] on Tuesday 09/13/21 at 12:30 and Ms Percifield is aware ?

## 2021-09-08 ENCOUNTER — Encounter (HOSPITAL_COMMUNITY): Payer: Self-pay | Admitting: Gastroenterology

## 2021-09-13 ENCOUNTER — Ambulatory Visit (HOSPITAL_COMMUNITY)
Admission: RE | Admit: 2021-09-13 | Discharge: 2021-09-13 | Disposition: A | Discharge status: Home / Self Care | Payer: Medicare Other | Source: Ambulatory Visit | Attending: Gastroenterology | Admitting: Gastroenterology

## 2021-09-13 DIAGNOSIS — I700 Atherosclerosis of aorta: Secondary | ICD-10-CM | POA: Diagnosis not present

## 2021-09-13 DIAGNOSIS — K6289 Other specified diseases of anus and rectum: Secondary | ICD-10-CM | POA: Insufficient documentation

## 2021-09-13 MED ORDER — IOHEXOL 300 MG/ML  SOLN
100.0000 mL | Freq: Once | INTRAMUSCULAR | Status: AC | PRN
  Administered 2021-09-13: 100 mL via INTRAVENOUS

## 2021-09-16 NOTE — Progress Notes (Signed)
Colonoscopy performed for evaluation of anemia ?

## 2021-09-21 DIAGNOSIS — M545 Low back pain, unspecified: Secondary | ICD-10-CM | POA: Diagnosis not present

## 2021-09-21 DIAGNOSIS — Z6824 Body mass index (BMI) 24.0-24.9, adult: Secondary | ICD-10-CM | POA: Diagnosis not present

## 2021-09-21 DIAGNOSIS — M818 Other osteoporosis without current pathological fracture: Secondary | ICD-10-CM | POA: Diagnosis not present

## 2021-09-21 DIAGNOSIS — I10 Essential (primary) hypertension: Secondary | ICD-10-CM | POA: Diagnosis not present

## 2021-09-21 DIAGNOSIS — E611 Iron deficiency: Secondary | ICD-10-CM | POA: Diagnosis not present

## 2021-09-21 DIAGNOSIS — Z0000 Encounter for general adult medical examination without abnormal findings: Secondary | ICD-10-CM | POA: Diagnosis not present

## 2021-09-21 DIAGNOSIS — M0689 Other specified rheumatoid arthritis, multiple sites: Secondary | ICD-10-CM | POA: Diagnosis not present

## 2021-10-07 ENCOUNTER — Inpatient Hospital Stay (HOSPITAL_COMMUNITY): Payer: Medicare Other

## 2021-10-07 ENCOUNTER — Inpatient Hospital Stay (HOSPITAL_COMMUNITY): Payer: Medicare Other | Attending: Hematology | Admitting: Hematology

## 2021-10-07 VITALS — BP 108/68 | HR 70 | Temp 98.1°F | Resp 18 | Ht 66.0 in | Wt 138.8 lb

## 2021-10-07 DIAGNOSIS — Z79899 Other long term (current) drug therapy: Secondary | ICD-10-CM | POA: Insufficient documentation

## 2021-10-07 DIAGNOSIS — R3915 Urgency of urination: Secondary | ICD-10-CM | POA: Insufficient documentation

## 2021-10-07 DIAGNOSIS — Z801 Family history of malignant neoplasm of trachea, bronchus and lung: Secondary | ICD-10-CM | POA: Diagnosis not present

## 2021-10-07 DIAGNOSIS — D259 Leiomyoma of uterus, unspecified: Secondary | ICD-10-CM | POA: Insufficient documentation

## 2021-10-07 DIAGNOSIS — D649 Anemia, unspecified: Secondary | ICD-10-CM | POA: Diagnosis not present

## 2021-10-07 LAB — CBC WITH DIFFERENTIAL/PLATELET
Abs Immature Granulocytes: 0.02 10*3/uL (ref 0.00–0.07)
Basophils Absolute: 0 10*3/uL (ref 0.0–0.1)
Basophils Relative: 1 %
Eosinophils Absolute: 0 10*3/uL (ref 0.0–0.5)
Eosinophils Relative: 1 %
HCT: 29.6 % — ABNORMAL LOW (ref 36.0–46.0)
Hemoglobin: 9.2 g/dL — ABNORMAL LOW (ref 12.0–15.0)
Immature Granulocytes: 0 %
Lymphocytes Relative: 26 %
Lymphs Abs: 1.4 10*3/uL (ref 0.7–4.0)
MCH: 29.2 pg (ref 26.0–34.0)
MCHC: 31.1 g/dL (ref 30.0–36.0)
MCV: 94 fL (ref 80.0–100.0)
Monocytes Absolute: 0.5 10*3/uL (ref 0.1–1.0)
Monocytes Relative: 10 %
Neutro Abs: 3.2 10*3/uL (ref 1.7–7.7)
Neutrophils Relative %: 62 %
Platelets: 222 10*3/uL (ref 150–400)
RBC: 3.15 MIL/uL — ABNORMAL LOW (ref 3.87–5.11)
RDW: 12.7 % (ref 11.5–15.5)
WBC: 5.1 10*3/uL (ref 4.0–10.5)
nRBC: 0 % (ref 0.0–0.2)

## 2021-10-07 LAB — VITAMIN B12: Vitamin B-12: 199 pg/mL (ref 180–914)

## 2021-10-07 LAB — RETICULOCYTES
Immature Retic Fract: 13.4 % (ref 2.3–15.9)
RBC.: 3.06 MIL/uL — ABNORMAL LOW (ref 3.87–5.11)
Retic Count, Absolute: 74.4 10*3/uL (ref 19.0–186.0)
Retic Ct Pct: 2.4 % (ref 0.4–3.1)

## 2021-10-07 LAB — IRON AND TIBC
Iron: 62 ug/dL (ref 28–170)
Saturation Ratios: 21 % (ref 10.4–31.8)
TIBC: 301 ug/dL (ref 250–450)
UIBC: 239 ug/dL

## 2021-10-07 LAB — FERRITIN: Ferritin: 308 ng/mL — ABNORMAL HIGH (ref 11–307)

## 2021-10-07 LAB — FOLATE: Folate: 7.6 ng/mL (ref >5.9)

## 2021-10-07 LAB — LACTATE DEHYDROGENASE: LDH: 142 U/L (ref 98–192)

## 2021-10-07 NOTE — Patient Instructions (Addendum)
[place] at [facility] Discharge Instructions  You were seen and examined today by Dr. Delton Coombes. Dr. Delton Coombes is a hematologist, meaning that he specializes in blood abnormalities. Dr. Delton Coombes discussed your past medical history, family history of cancers/blood conditions and the events that led to you being here today.  You were referred to Dr. Delton Coombes due to anemia. Dr. Delton Coombes has recommended additional lab work today in an attempt to identify the cause of your anemia.  Follow-up as scheduled.   Thank you for choosing [place] at [facility] to provide your oncology and hematology care.  To afford each patient quality time with our provider, please arrive at least 15 minutes before your scheduled appointment time.   If you have a lab appointment with the [facility] please come in thru the Main Entrance and check in at the main information desk.  You need to re-schedule your appointment should you arrive 10 or more minutes late.  We strive to give you quality time with our providers, and arriving late affects you and other patients whose appointments are after yours.  Also, if you no show three or more times for appointments you may be dismissed from the clinic at the providers discretion.     Again, thank you for choosing [facility].  Our hope is that these requests will decrease the amount of time that you wait before being seen by our physicians.       _____________________________________________________________  Should you have questions after your visit to [facility], please contact our office at [phone] and follow the prompts.  Our office hours are 8:00 a.m. and 4:30 p.m. Monday - Friday.  Please note that voicemails left after 4:00 p.m. may not be returned until the following business day.  We are closed weekends and major holidays.  You do have access to a nurse 24-7, just call the main  number to the clinic [phone] and do not press any options, hold on the line and a nurse will answer the phone.    For prescription refill requests, have your pharmacy contact our office and allow 72 hours.    Due to Covid, you will need to wear a mask upon entering the hospital. If you do not have a mask, a mask will be given to you at the Main Entrance upon arrival. For doctor visits, patients may have 1 support person age 17 or older with them. For treatment visits, patients can not have anyone with them due to social distancing guidelines and our immunocompromised population.

## 2021-10-07 NOTE — Progress Notes (Signed)
[place] [address], [place] [address]   CLINIC:  Medical Oncology/Hematology  Patient Care Team: Neale Burly, MD as PCP - General (Internal Medicine) Derek Jack, MD as Medical Oncologist (Hematology)  CHIEF COMPLAINTS/PURPOSE OF CONSULTATION:  Evaluation for normocytic anemia  HISTORY OF PRESENTING ILLNESS:  Veronica Howard 81 y.o. female is here because for normocytic anemia, at the request of Dr. Jenetta Downer.  Today she reports feeling good, and she is accompanied by her daughter. She denies hematochezia and black stools. She reports urinary urgency. She denies vaginal bleeding. She reports she needed a prior blood transfusion and took iron when she was a child, but she denies recent history of blood transfusions. She denies ice pica.  She lives at home with her daughter. She is able to do her typical daily activities at home. 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 She denies chemical exposure. She denies smoking and drinking history. She denies family history of sickle cell disease. Her daughter has anemia, and her brother had possible lung cancer.   MEDICAL HISTORY:  Past Medical History:  Diagnosis Date   Arthritis    Hypertension     SURGICAL HISTORY: Past Surgical History:  Procedure Laterality Date   CATARACT EXTRACTION W/PHACO Left 09/22/2016   Procedure: CATARACT EXTRACTION PHACO AND INTRAOCULAR LENS PLACEMENT LEFT EYE CDE= 13.37;  Surgeon: Baruch Goldmann, MD;  Location: [facility];  Service: Ophthalmology;  Laterality: Left;  left   CATARACT EXTRACTION W/PHACO Right 02/04/2021   Procedure: CATARACT EXTRACTION PHACO AND INTRAOCULAR LENS PLACEMENT (IOC);  Surgeon: Baruch Goldmann, MD;  Location: [facility];  Service: Ophthalmology;  Laterality: Right;  CDE 21.63   COLONOSCOPY WITH PROPOFOL N/A 09/06/2021   Procedure: COLONOSCOPY WITH PROPOFOL;  Surgeon: Harvel Quale, MD;   Location: [facility];  Service: Gastroenterology;  Laterality: N/A;  [place] Right 2006   REPLACEMENT TOTAL KNEE Right 2013   REPLACEMENT TOTAL KNEE Left 2014   TOTAL ELBOW REPLACEMENT Right 2019   [facility]    SOCIAL HISTORY: Social History   Socioeconomic History   Marital status: Widowed    Spouse name: Not on file   Number of children: Not on file   Years of education: Not on file   Highest education level: Not on file  Occupational History   Not on file  Tobacco Use   Smoking status: Never    Passive exposure: Current   Smokeless tobacco: Never  Vaping Use   Vaping Use: Never used  Substance and Sexual Activity   Alcohol use: No   Drug use: No   Sexual activity: Yes  Other Topics Concern   Not on file  Social History Narrative   Not on file   Social Determinants of Health   Financial Resource Strain: Not on file  Food Insecurity: Not on file  Transportation Needs: Not on file  Physical Activity: Not on file  Stress: Not on file  Social Connections: Not on file  Intimate Partner Violence: Not on file    FAMILY HISTORY: Family History  Problem Relation Age of Onset   Arthritis Father     ALLERGIES:  has no active allergies.  MEDICATIONS:  Current Outpatient Medications  Medication Sig Dispense Refill   alendronate (FOSAMAX) 70 MG tablet Take 70 mg by mouth every Monday.     amLODipine (NORVASC) 10 MG tablet Take 10  mg by mouth daily.     CALCIUM-VITAMIN D PO Take 1 tablet by mouth daily. 600 mg calcium plus d3 one daily     gabapentin (NEURONTIN) 100 MG capsule Take 100 mg by mouth 3 (three) times daily.     hydroxychloroquine (PLAQUENIL) 200 MG tablet Take 200 mg by mouth 2 (two) times daily.     labetalol (NORMODYNE) 300 MG tablet Take 300 mg by mouth 2 (two) times daily.     lisinopril-hydrochlorothiazide (PRINZIDE,ZESTORETIC) 20-25 MG tablet Take 1 tablet by mouth daily.     oxyCODONE-acetaminophen  (PERCOCET/ROXICET) 5-325 MG tablet Take 1 tablet by mouth every 8 (eight) hours as needed for pain.     potassium chloride SA (KLOR-CON) 20 MEQ tablet Take 20 mEq by mouth daily.     Vitamin D, Ergocalciferol, (DRISDOL) 50000 units CAPS capsule Take 50,000 Units by mouth every Tuesday.     No current facility-administered medications for this visit.    REVIEW OF SYSTEMS:   Review of Systems  Constitutional:  Negative for appetite change and fatigue.  Respiratory:  Positive for shortness of breath.   Gastrointestinal:  Negative for blood in stool.  Genitourinary:  Negative for vaginal bleeding.   Musculoskeletal:  Positive for arthralgias (6/10).  Neurological:  Positive for numbness.  All other systems reviewed and are negative.   PHYSICAL EXAMINATION: ECOG PERFORMANCE STATUS: 1 - Symptomatic but completely ambulatory  Vitals:   10/07/21 1215  BP: 108/68  Pulse: 70  Resp: 18  Temp: 98.1 F (36.7 C)  SpO2: 100%   Filed Weights   10/07/21 1215  Weight: 138 lb 12.8 oz (63 kg)   Physical Exam Vitals reviewed.  Constitutional:      Appearance: Normal appearance.  Cardiovascular:     Rate and Rhythm: Normal rate and regular rhythm.     Pulses: Normal pulses.     Heart sounds: Normal heart sounds.  Pulmonary:     Effort: Pulmonary effort is normal.     Breath sounds: Normal breath sounds.  Abdominal:     Palpations: Abdomen is soft. There is no hepatomegaly, splenomegaly or mass.     Tenderness: There is no abdominal tenderness.  Musculoskeletal:     Right lower leg: No edema.     Left lower leg: No edema.  Lymphadenopathy:     Cervical: No cervical adenopathy.     Right cervical: No superficial cervical adenopathy.    Left cervical: No superficial cervical adenopathy.     Upper Body:     Right upper body: No supraclavicular or axillary adenopathy.     Left upper body: No supraclavicular or axillary adenopathy.     Lower Body: No right inguinal adenopathy. No left  inguinal adenopathy.  Neurological:     General: No focal deficit present.     Mental Status: She is alert and oriented to person, place, and time.  Psychiatric:        Mood and Affect: Mood normal.        Behavior: Behavior normal.     LABORATORY DATA:  I have reviewed the data as listed Recent Results (from the past 2160 hour(s))  Basic metabolic panel     Status: Abnormal   Collection Time: 09/02/21 12:34 PM  Result Value Ref Range   Sodium 133 (L) 135 - 145 mmol/L   Potassium 3.0 (L) 3.5 - 5.1 mmol/L   Chloride 101 98 - 111 mmol/L   CO2 22 22 - 32 mmol/L   Glucose,  Bld 101 (H) 70 - 99 mg/dL    Comment: Glucose reference range applies only to samples taken after fasting for at least 8 hours.   BUN 21 8 - 23 mg/dL   Creatinine, Ser 0.99 0.44 - 1.00 mg/dL   Calcium 9.9 8.9 - 10.3 mg/dL   GFR, Estimated 57 (L) >60 mL/min    Comment: (NOTE) Calculated using the CKD-EPI Creatinine Equation (2021)    Anion gap 10 5 - 15    Comment: Performed at [facility], [address] [place], [place] [address]  Ferritin     Status: Abnormal   Collection Time: 09/02/21 12:34 PM  Result Value Ref Range   Ferritin 310 (H) 11 - 307 ng/mL    Comment: Performed at [facility], [address], [place], [place] [address]  Vitamin B12     Status: None   Collection Time: 09/02/21 12:34 PM  Result Value Ref Range   Vitamin B-12 205 180 - 914 pg/mL    Comment: (NOTE) This assay is not validated for testing neonatal or myeloproliferative syndrome specimens for Vitamin B12 levels. Performed at [facility], [address] [place], [place] [address]   Folate, serum, performed at [facility] lab     Status: None   Collection Time: 09/02/21 12:34 PM  Result Value Ref Range   Folate 7.0 >5.9 ng/mL    Comment: Performed at [facility], [address] [place], [place] [address]  Iron and TIBC     Status: None   Collection Time: 09/02/21 12:34 PM  Result Value Ref Range   Iron 50 28 - 170 ug/dL    TIBC 310 250 - 450 ug/dL   Saturation Ratios 16 10.4 - 31.8 %   UIBC 260 ug/dL    Comment: Performed at [facility], [address], [place], [place] [address]  CBC     Status: Abnormal   Collection Time: 09/02/21 12:34 PM  Result Value Ref Range   WBC 5.5 4.0 - 10.5 K/uL   RBC 3.32 (L) 3.87 - 5.11 MIL/uL   Hemoglobin 9.9 (L) 12.0 - 15.0 g/dL   HCT 30.2 (L) 36.0 - 46.0 %   MCV 91.0 80.0 - 100.0 fL   MCH 29.8 26.0 - 34.0 pg   MCHC 32.8 30.0 - 36.0 g/dL   RDW 12.2 11.5 - 15.5 %   Platelets 212 150 - 400 K/uL   nRBC 0.0 0.0 - 0.2 %    Comment: Performed at [facility], [address] [place], [place] [address]  HM COLONOSCOPY     Status: None   Collection Time: 09/06/21 12:00 AM  Result Value Ref Range   HM Colonoscopy See Report (in chart) See Report (in chart), Patient Reported  Glucose, capillary     Status: Abnormal   Collection Time: 09/06/21 10:33 AM  Result Value Ref Range   Glucose-Capillary 108 (H) 70 - 99 mg/dL    Comment: Glucose reference range applies only to samples taken after fasting for at least 8 hours.    RADIOGRAPHIC STUDIES: I have personally reviewed the radiological images as listed and agreed with the findings in the report. CT Abdomen Pelvis W Contrast  Result Date: 09/14/2021 CLINICAL DATA:  Mass compressing rectum EXAM: CT ABDOMEN AND PELVIS WITH CONTRAST TECHNIQUE: Multidetector CT imaging of the abdomen and pelvis was performed using the standard protocol following bolus administration of intravenous contrast. RADIATION DOSE REDUCTION: This exam was performed according to the departmental dose-optimization program which includes automated  exposure control, adjustment of the mA and/or kV according to patient size and/or use of iterative reconstruction technique. CONTRAST:  165m OMNIPAQUE IOHEXOL 300 MG/ML  SOLN COMPARISON:  None. FINDINGS: Lower chest: No acute abnormality Hepatobiliary: No focal hepatic abnormality. Gallbladder unremarkable. Pancreas: No  focal abnormality or ductal dilatation. Spleen: No focal abnormality.  Normal size. Adrenals/Urinary Tract: No adrenal abnormality. No focal renal abnormality. No stones or hydronephrosis. Urinary bladder is unremarkable. Stomach/Bowel: There is extrinsic compression on the anterior right rectum by a large calcified fibroid which measures up to 6.1 cm. Stomach, large and small bowel grossly unremarkable. Vascular/Lymphatic: Aortic atherosclerosis. No evidence of aneurysm or adenopathy. Reproductive: Multiple calcified uterine fibroids, the largest 6.1 cm and pressing on the anterior right portion of the rectum as described above. Other: No free fluid or free air. Musculoskeletal: No acute bony abnormality. IMPRESSION: Multiple calcified uterine fibroids, the largest 6.1 cm in the lower uterine segment region which presses on the adjacent rectum. No bowel abnormality. No acute findings. Electronically Signed   By: KRolm BaptiseM.D.   On: 09/14/2021 01:16    ASSESSMENT:  Normocytic anemia: - Patient seen at the request of Dr. CJenetta Downer - Colonoscopy (09/06/2021): Normal ileum with decompressed rectum extrinsically. - CTAP with contrast (09/11/2021): Multiple calcified uterine fibroids, largest 6.1 cm in the lower uterine segment region pressing adjacent rectum. - She had blood transfusion when she was a kid.  No pica symptoms.  No vaginal bleeding. - CBC on 09/02/2021 hemoglobin 9.9, MCV 91, ferritin 310, percent saturation 16.   Social/family history: - She lives with her daughter at home.  She is able to do her ADLs.  Some ADLs are limited because of right elbow surgery.  She worked in [employer]  No chemical exposure.  Non-smoker. - Brother had lung cancer.   PLAN:  Normocytic anemia: - We have reviewed her recent labs, colonoscopy and imaging reports. - Recommend repeating CBC and checking for other nutritional deficiencies. - Differential diagnosis includes anemia from chronic inflammation from  rheumatoid arthritis versus early myelodysplasia. - RTC 2 weeks for follow-up.   All questions were answered. The patient knows to call the clinic with any problems, questions or concerns.  SDerek Jack MD 10/07/21 1:28 PM  [phone]  I, KThana Ates am acting as a scribe for Dr. SDerek Jack  I, SDerek JackMD, have reviewed the above documentation for accuracy and completeness, and I agree with the above.

## 2021-10-08 LAB — HAPTOGLOBIN: Haptoglobin: 83 mg/dL (ref 41–333)

## 2021-10-09 LAB — COPPER, SERUM: Copper: 123 ug/dL (ref 80–158)

## 2021-10-11 LAB — PROTEIN ELECTROPHORESIS, SERUM
A/G Ratio: 1.1 (ref 0.7–1.7)
Albumin ELP: 3.9 g/dL (ref 2.9–4.4)
Alpha-1-Globulin: 0.3 g/dL (ref 0.0–0.4)
Alpha-2-Globulin: 0.6 g/dL (ref 0.4–1.0)
Beta Globulin: 1 g/dL (ref 0.7–1.3)
Gamma Globulin: 1.6 g/dL (ref 0.4–1.8)
Globulin, Total: 3.6 g/dL (ref 2.2–3.9)
Total Protein ELP: 7.5 g/dL (ref 6.0–8.5)

## 2021-10-12 LAB — METHYLMALONIC ACID, SERUM: Methylmalonic Acid, Quantitative: 146 nmol/L (ref 0–378)

## 2021-10-20 ENCOUNTER — Encounter: Payer: Self-pay | Admitting: Obstetrics & Gynecology

## 2021-10-20 ENCOUNTER — Ambulatory Visit: Payer: Medicare Other | Admitting: Obstetrics & Gynecology

## 2021-10-20 DIAGNOSIS — D219 Benign neoplasm of connective and other soft tissue, unspecified: Secondary | ICD-10-CM | POA: Diagnosis not present

## 2021-10-20 NOTE — Progress Notes (Signed)
Follow up appointment for results: Fibroids, referral by Dr Laural Golden  Chief Complaint  Patient presents with   New Patient (Initial Visit)   Fibroids    There were no vitals taken for this visit.  CLINICAL DATA:  Mass compressing rectum   EXAM: CT ABDOMEN AND PELVIS WITH CONTRAST   TECHNIQUE: Multidetector CT imaging of the abdomen and pelvis was performed using the standard protocol following bolus administration of intravenous contrast.   RADIATION DOSE REDUCTION: This exam was performed according to the departmental dose-optimization program which includes automated exposure control, adjustment of the mA and/or kV according to patient size and/or use of iterative reconstruction technique.   CONTRAST:  111m OMNIPAQUE IOHEXOL 300 MG/ML  SOLN   COMPARISON:  None.   FINDINGS: Lower chest: No acute abnormality   Hepatobiliary: No focal hepatic abnormality. Gallbladder unremarkable.   Pancreas: No focal abnormality or ductal dilatation.   Spleen: No focal abnormality.  Normal size.   Adrenals/Urinary Tract: No adrenal abnormality. No focal renal abnormality. No stones or hydronephrosis. Urinary bladder is unremarkable.   Stomach/Bowel: There is extrinsic compression on the anterior right rectum by a large calcified fibroid which measures up to 6.1 cm. Stomach, large and small bowel grossly unremarkable.   Vascular/Lymphatic: Aortic atherosclerosis. No evidence of aneurysm or adenopathy.   Reproductive: Multiple calcified uterine fibroids, the largest 6.1 cm and pressing on the anterior right portion of the rectum as described above.   Other: No free fluid or free air.   Musculoskeletal: No acute bony abnormality.   IMPRESSION: Multiple calcified uterine fibroids, the largest 6.1 cm in the lower uterine segment region which presses on the adjacent rectum.   No bowel abnormality.   No acute findings.     Electronically Signed   By: KRolm BaptiseM.D.    On: 09/14/2021 01:16  Multiple calcified fibroids, images reviewed no concerns, all caclified and no suspicion of anything more sinister  MEDS ordered this encounter: No orders of the defined types were placed in this encounter.   Orders for this encounter: No orders of the defined types were placed in this encounter.   Impression + Management Plan   ICD-10-CM   1. Fibroids  D21.9    No clinical concern, will order sonogram 6 months just to ensure stability      Follow Up: Return in about 6 months (around 04/21/2022) for GYN sono, Follow up, with Dr EElonda Husky     All questions were answered.  Past Medical History:  Diagnosis Date   Arthritis    Hypertension     Past Surgical History:  Procedure Laterality Date   CATARACT EXTRACTION W/PHACO Left 09/22/2016   Procedure: CATARACT EXTRACTION PHACO AND INTRAOCULAR LENS PLACEMENT LEFT EYE CDE= 13.37;  Surgeon: WBaruch Goldmann MD;  Location: [facility];  Service: Ophthalmology;  Laterality: Left;  left   CATARACT EXTRACTION W/PHACO Right 02/04/2021   Procedure: CATARACT EXTRACTION PHACO AND INTRAOCULAR LENS PLACEMENT (IOC);  Surgeon: WBaruch Goldmann MD;  Location: [facility];  Service: Ophthalmology;  Laterality: Right;  CDE 21.63   COLONOSCOPY WITH PROPOFOL N/A 09/06/2021   Procedure: COLONOSCOPY WITH PROPOFOL;  Surgeon: CHarvel Quale MD;  Location: [facility];  Service: Gastroenterology;  Laterality: N/A;  [place]   REPLACEMENT TOTAL KNEE Right 2013   REPLACEMENT TOTAL KNEE Left 2014   [facility]   OB History     Gravida  4  Para  4   Term  4   Preterm      AB      Living  3      SAB      IAB      Ectopic      Multiple      Live Births  4           No Known Allergies  Social History   Socioeconomic History   Marital status: Widowed    Spouse name: Not on file   Number of children: Not on file   Years of  education: Not on file   Highest education level: Not on file  Occupational History   Not on file  Tobacco Use   Smoking status: Never    Passive exposure: Current   Smokeless tobacco: Never  Vaping Use   Vaping Use: Never used  Substance and Sexual Activity   Alcohol use: No   Drug use: No   Sexual activity: Not Currently    Birth control/protection: None  Other Topics Concern   Not on file  Social History Narrative   Not on file   Social Determinants of Health   Financial Resource Strain: Medium Risk (10/20/2021)   Overall Financial Resource Strain (CARDIA)    Difficulty of Paying Living Expenses: Somewhat hard  Food Insecurity: No Food Insecurity (10/20/2021)   Hunger Vital Sign    Worried About Running Out of Food in the Last Year: Never true    Ran Out of Food in the Last Year: Never true  Transportation Needs: No Transportation Needs (10/20/2021)   PRAPARE - Hydrologist (Medical): No    Lack of Transportation (Non-Medical): No  Physical Activity: Inactive (10/20/2021)   Exercise Vital Sign    Days of Exercise per Week: 0 days    Minutes of Exercise per Session: 0 min  Stress: No Stress Concern Present (10/20/2021)   [place]    Feeling of Stress : Not at all  Social Connections: Moderately Isolated (10/20/2021)   Social Connection and Isolation Panel [NHANES]    Frequency of Communication with Friends and Family: More than three times a week    Frequency of Social Gatherings with Friends and Family: Twice a week    Attends Religious Services: More than 4 times per year    Active Member of [employer] or Organizations: No    Attends Archivist Meetings: Never    Marital Status: Widowed    Family History  Problem Relation Age of Onset   Arthritis Father

## 2021-10-24 ENCOUNTER — Encounter: Payer: Medicare Other | Admitting: Obstetrics & Gynecology

## 2021-10-26 ENCOUNTER — Ambulatory Visit (HOSPITAL_COMMUNITY): Payer: Medicare Other | Admitting: Physician Assistant

## 2021-11-01 ENCOUNTER — Encounter (HOSPITAL_COMMUNITY): Payer: Self-pay | Admitting: Physician Assistant

## 2021-11-01 ENCOUNTER — Inpatient Hospital Stay (HOSPITAL_COMMUNITY): Payer: Medicare Other | Attending: Hematology | Admitting: Physician Assistant

## 2021-11-01 VITALS — HR 67 | Temp 97.9°F | Resp 18 | Wt 137.5 lb

## 2021-11-01 DIAGNOSIS — N1831 Chronic kidney disease, stage 3a: Secondary | ICD-10-CM | POA: Insufficient documentation

## 2021-11-01 DIAGNOSIS — Z801 Family history of malignant neoplasm of trachea, bronchus and lung: Secondary | ICD-10-CM | POA: Diagnosis not present

## 2021-11-01 DIAGNOSIS — R5383 Other fatigue: Secondary | ICD-10-CM | POA: Insufficient documentation

## 2021-11-01 DIAGNOSIS — Z79899 Other long term (current) drug therapy: Secondary | ICD-10-CM | POA: Insufficient documentation

## 2021-11-01 DIAGNOSIS — D259 Leiomyoma of uterus, unspecified: Secondary | ICD-10-CM | POA: Diagnosis not present

## 2021-11-01 DIAGNOSIS — D649 Anemia, unspecified: Secondary | ICD-10-CM | POA: Insufficient documentation

## 2021-11-01 NOTE — Progress Notes (Signed)
[place], [place] [address]   CLINIC:  Medical Oncology/Hematology  PCP:  Neale Burly, MD [place] [address] [phone]   REASON FOR VISIT:  Follow-up for normocytic anemia  PRIOR THERAPY: None  CURRENT THERAPY: Under work-up  INTERVAL HISTORY:  Veronica Howard 81 y.o. female returns for routine follow-up of her normocytic anemia.  She was seen for initial consultation by Dr. Delton Coombes on 10/07/2021 and returns today to discuss results from that work-up.  At today's visit, she reports feeling fair.  She denies any changes in her health status since her visit with Dr. Delton Coombes last month.  She denies any signs of bleeding such as epistaxis, hematemesis, hematochezia, melena, or hematuria.  She denies any unexplained fever, chills, or night sweats.  She does report that she is slowly losing weight secondary to decreased appetite, and she is noted to have lost about 3 pounds in the past month. She reports ongoing fatigue with energy about 25%.   REVIEW OF SYSTEMS:  Review of Systems  Constitutional:  Positive for fatigue. Negative for appetite change, chills, diaphoresis, fever and unexpected weight change.  HENT:   Negative for lump/mass and nosebleeds.   Eyes:  Negative for eye problems.  Respiratory:  Negative for cough, hemoptysis and shortness of breath.   Cardiovascular:  Positive for leg swelling. Negative for chest pain and palpitations.  Gastrointestinal:  Negative for abdominal pain, blood in stool, constipation, diarrhea, nausea and vomiting.  Genitourinary:  Negative for hematuria.   Skin: Negative.   Neurological:  Negative for dizziness, headaches and light-headedness.  Hematological:  Does not bruise/bleed easily.      PAST MEDICAL/SURGICAL HISTORY:  Past Medical History:  Diagnosis Date   Arthritis    Hypertension    Past Surgical History:  Procedure Laterality Date   CATARACT EXTRACTION W/PHACO  Left 09/22/2016   Procedure: CATARACT EXTRACTION PHACO AND INTRAOCULAR LENS PLACEMENT LEFT EYE CDE= 13.37;  Surgeon: Baruch Goldmann, MD;  Location: [facility];  Service: Ophthalmology;  Laterality: Left;  left   CATARACT EXTRACTION W/PHACO Right 02/04/2021   Procedure: CATARACT EXTRACTION PHACO AND INTRAOCULAR LENS PLACEMENT (IOC);  Surgeon: Baruch Goldmann, MD;  Location: [facility];  Service: Ophthalmology;  Laterality: Right;  CDE 21.63   COLONOSCOPY WITH PROPOFOL N/A 09/06/2021   Procedure: COLONOSCOPY WITH PROPOFOL;  Surgeon: Harvel Quale, MD;  Location: [facility];  Service: Gastroenterology;  Laterality: N/A;  [place] Right 2006   REPLACEMENT TOTAL KNEE Right 2013   REPLACEMENT TOTAL KNEE Left 2014   TOTAL ELBOW REPLACEMENT Right 2019   [facility]     SOCIAL HISTORY:  Social History   Socioeconomic History   Marital status: Widowed    Spouse name: Not on file   Number of children: Not on file   Years of education: Not on file   Highest education level: Not on file  Occupational History   Not on file  Tobacco Use   Smoking status: Never    Passive exposure: Current   Smokeless tobacco: Never  Vaping Use   Vaping Use: Never used  Substance and Sexual Activity   Alcohol use: No   Drug use: No   Sexual activity: Not Currently    Birth control/protection: None  Other Topics Concern   Not on file  Social History Narrative   Not on file   Social Determinants of Health   Financial Resource Strain:  Medium Risk (10/20/2021)   Overall Financial Resource Strain (CARDIA)    Difficulty of Paying Living Expenses: Somewhat hard  Food Insecurity: No Food Insecurity (10/20/2021)   Hunger Vital Sign    Worried About Running Out of Food in the Last Year: Never true    Ran Out of Food in the Last Year: Never true  Transportation Needs: No Transportation Needs (10/20/2021)   PRAPARE - Hydrologist (Medical): No    Lack of  Transportation (Non-Medical): No  Physical Activity: Inactive (10/20/2021)   Exercise Vital Sign    Days of Exercise per Week: 0 days    Minutes of Exercise per Session: 0 min  Stress: No Stress Concern Present (10/20/2021)   [place]    Feeling of Stress : Not at all  Social Connections: Moderately Isolated (10/20/2021)   Social Connection and Isolation Panel [NHANES]    Frequency of Communication with Friends and Family: More than three times a week    Frequency of Social Gatherings with Friends and Family: Twice a week    Attends Religious Services: More than 4 times per year    Active Member of [employer] or Organizations: No    Attends Archivist Meetings: Never    Marital Status: Widowed  Intimate Partner Violence: Not At Risk (10/20/2021)   Humiliation, Afraid, Rape, and Kick questionnaire    Fear of Current or Ex-Partner: No    Emotionally Abused: No    Physically Abused: No    Sexually Abused: No    FAMILY HISTORY:  Family History  Problem Relation Age of Onset   Arthritis Father     CURRENT MEDICATIONS:  Outpatient Encounter Medications as of 11/01/2021  Medication Sig   alendronate (FOSAMAX) 70 MG tablet Take 70 mg by mouth every Monday.   amLODipine (NORVASC) 10 MG tablet Take 10 mg by mouth daily.   CALCIUM-VITAMIN D PO Take 1 tablet by mouth daily. 600 mg calcium plus d3 one daily   gabapentin (NEURONTIN) 100 MG capsule Take 100 mg by mouth 3 (three) times daily.   hydroxychloroquine (PLAQUENIL) 200 MG tablet Take 200 mg by mouth 2 (two) times daily.   labetalol (NORMODYNE) 300 MG tablet Take 300 mg by mouth 2 (two) times daily.   lisinopril-hydrochlorothiazide (PRINZIDE,ZESTORETIC) 20-25 MG tablet Take 1 tablet by mouth daily.   oxyCODONE-acetaminophen (PERCOCET/ROXICET) 5-325 MG tablet Take 1 tablet by mouth every 8 (eight) hours as needed for pain.   potassium chloride SA (KLOR-CON) 20 MEQ tablet  Take 20 mEq by mouth daily.   Vitamin D, Ergocalciferol, (DRISDOL) 50000 units CAPS capsule Take 50,000 Units by mouth every Tuesday.   No facility-administered encounter medications on file as of 11/01/2021.    ALLERGIES:  No Known Allergies   PHYSICAL EXAM:  ECOG PERFORMANCE STATUS: 1 - Symptomatic but completely ambulatory  There were no vitals filed for this visit. There were no vitals filed for this visit. Physical Exam Constitutional:      Appearance: Normal appearance.  HENT:     Head: Normocephalic and atraumatic.     Mouth/Throat:     Mouth: Mucous membranes are moist.  Eyes:     Extraocular Movements: Extraocular movements intact.     Pupils: Pupils are equal, round, and reactive to light.  Cardiovascular:     Rate and Rhythm: Normal rate and regular rhythm.     Pulses: Normal pulses.     Heart sounds:  Normal heart sounds.  Pulmonary:     Effort: Pulmonary effort is normal.     Breath sounds: Normal breath sounds.  Abdominal:     General: Bowel sounds are normal.     Palpations: Abdomen is soft.     Tenderness: There is no abdominal tenderness.  Musculoskeletal:        General: No swelling.     Right lower leg: Edema (1+) present.     Left lower leg: Edema (1+) present.  Lymphadenopathy:     Cervical: No cervical adenopathy.  Skin:    General: Skin is warm and dry.  Neurological:     General: No focal deficit present.     Mental Status: She is alert and oriented to person, place, and time.  Psychiatric:        Mood and Affect: Mood normal.        Behavior: Behavior normal.      LABORATORY DATA:  I have reviewed the labs as listed.  CBC    Component Value Date/Time   WBC 5.1 10/07/2021 1313   RBC 3.06 (L) 10/07/2021 1313   RBC 3.15 (L) 10/07/2021 1313   HGB 9.2 (L) 10/07/2021 1313   HCT 29.6 (L) 10/07/2021 1313   PLT 222 10/07/2021 1313   MCV 94.0 10/07/2021 1313   MCH 29.2 10/07/2021 1313   MCHC 31.1 10/07/2021 1313   RDW 12.7 10/07/2021  1313   LYMPHSABS 1.4 10/07/2021 1313   MONOABS 0.5 10/07/2021 1313   EOSABS 0.0 10/07/2021 1313   BASOSABS 0.0 10/07/2021 1313      Latest Ref Rng & Units 09/02/2021   12:34 PM 09/22/2016    7:20 AM 09/18/2016    3:34 PM  CMP  Glucose 70 - 99 mg/dL 101  109  98   BUN 8 - 23 mg/dL 21   16   Creatinine 0.44 - 1.00 mg/dL 0.99   0.83   Sodium 135 - 145 mmol/L 133  139  136   Potassium 3.5 - 5.1 mmol/L 3.0  3.2  2.7   Chloride 98 - 111 mmol/L 101   101   CO2 22 - 32 mmol/L 22   27   Calcium 8.9 - 10.3 mg/dL 9.9   9.6     DIAGNOSTIC IMAGING:  I have independently reviewed the relevant imaging and discussed with the patient.  ASSESSMENT & PLAN: 1.  Normocytic anemia - Patient seen at the request of Dr. Jenetta Downer. - Colonoscopy (09/06/2021): Normal ileum with decompressed rectum extrinsically. - CTAP with contrast (09/11/2021): Multiple calcified uterine fibroids, largest 6.1 cm in the lower uterine segment region pressing adjacent rectum. - She had blood transfusion and required iron supplementation as a young adult. - Patient's daughter has anemia, but daughter's anemia is secondary to CKD/IDA rather than heritable anemia - No vaginal bleeding, prior blood per rectum, melena, epistaxis, or hematuria. - Symptomatic with fatigue.  Denies any pica, syncope, or dyspnea on exertion. - Hematology work-up (10/07/2021):  Normal SPEP.  Reticulocytes 2.4%, LDH 142.  Haptoglobin normal.  Normal iron (ferritin 308, iron saturation 21%, TIBC 301).  Normal copper and folate.  Marginal vitamin B12 at 199, but methylmalonic acid normal. - Most recent CBC (10/07/2021): Hgb 9.2/MCV 94.0, otherwise normal CBC. - Cologuard was ordered by PCP, but unable to be completed since patient could not open the kit due to her rheumatoid arthritis. - Unclear etiology of anemia at this time. No signs of renal dysfunction. Nutritional panel, myeloma panel, and hemolysis  panel were negative. Would consider possible occult  blood loss (unlikely since iron panel was normal) Unlikely to be anemia of chronic disease based on her iron panel (normal iron saturation/TIBC). Differential includes early MDS or other bone marrow infiltrative disorder. - PLAN: Continue sublingual B12 due to marginal vitamin B12 levels.  - Check cystatin to rule out occult kidney disease.  Check eryhtropoeitin.  Check free light chains and immunofixation to rule out light chain MGUS/myeloma in the absence of an M spike. - Check stool cards x3 to rule out occult bleeding - If above tests negative, will check bone marrow biopsy, if patient agrees.  We did discuss possible bone marrow biopsy today, and patient was hesitant and would like some time to think about this. - Phone visit in 2 weeks to discuss results and next steps  2.  Social/family history: - She lives with her daughter at home.  She is able to do her ADLs.  Some ADLs are limited because of right elbow surgery.  She worked in [place].  No chemical exposure.  Non-smoker. - Brother had lung cancer.   PLAN SUMMARY & DISPOSITION: Labs this week Stool cards x3 Phone visit in 2 weeks to discuss results  All questions were answered. The patient knows to call the clinic with any problems, questions or concerns.  Medical decision making: Moderate  Time spent on visit: I spent 20 minutes counseling the patient face to face. The total time spent in the appointment was 30 minutes and more than 50% was on counseling.   Veronica Rush, PA-C  11/01/2021 5:51 PM

## 2021-11-01 NOTE — Patient Instructions (Signed)
[facility] at [facility] Discharge Instructions  You were seen today by Rebekah Pennington PA-C for your anemia (low red blood cells).    We do not yet know the cause of your anemia, so we will check some additional labs next week (lab appointment on Tuesday 11/08/2021 at 8:50 AM).  We will also check stool cards x3 to see if you have any signs of blood in your bowel movements.  Please complete all 3 stool cards this week and bring them to your lab appointment on Tuesday 11/08/2021.  We will discuss these results with a TELEPHONE VISIT at 3 PM on Friday, 11/18/2021.  If the above tests do not show any obvious cause of your anemia, I recommend scheduling a bone marrow biopsy to see if you have any bone marrow disorders such as cancer or myelodysplastic syndrome that could be causing your anemia.   - - - - - - -   Thank you for choosing [facility] at [facility] to provide your oncology and hematology care.  To afford each patient quality time with our provider, please arrive at least 15 minutes before your scheduled appointment time.   If you have a lab appointment with the Cancer Center please come in thru the Main Entrance and check in at the main information desk.  You need to re-schedule your appointment should you arrive 10 or more minutes late.  We strive to give you quality time with our providers, and arriving late affects you and other patients whose appointments are after yours.  Also, if you no show three or more times for appointments you may be dismissed from the clinic at the providers discretion.     Again, thank you for choosing [facility].  Our hope is that these requests will decrease the amount of time that you wait before being seen by our physicians.       _____________________________________________________________  Should you have questions after your visit to [facility], please contact our office  at [phone] and follow the prompts.  Our office hours are 8:00 a.m. and 4:30 p.m. Monday - Friday.  Please note that voicemails left after 4:00 p.m. may not be returned until the following business day.  We are closed weekends and major holidays.  You do have access to a nurse 24-7, just call the main number to the clinic [phone] and do not press any options, hold on the line and a nurse will answer the phone.    For prescription refill requests, have your pharmacy contact our office and allow 72 hours.    Due to Covid, you will need to wear a mask upon entering the hospital. If you do not have a mask, a mask will be given to you at the Main Entrance upon arrival. For doctor visits, patients may have 1 support person age 18 or older with them. For treatment visits, patients can not have anyone with them due to social distancing guidelines and our immunocompromised population.     

## 2021-11-08 ENCOUNTER — Other Ambulatory Visit (HOSPITAL_COMMUNITY): Payer: Medicare Other

## 2021-11-08 ENCOUNTER — Inpatient Hospital Stay (HOSPITAL_COMMUNITY): Payer: Medicare Other

## 2021-11-08 DIAGNOSIS — N1831 Chronic kidney disease, stage 3a: Secondary | ICD-10-CM | POA: Diagnosis not present

## 2021-11-08 DIAGNOSIS — Z801 Family history of malignant neoplasm of trachea, bronchus and lung: Secondary | ICD-10-CM | POA: Diagnosis not present

## 2021-11-08 DIAGNOSIS — R5383 Other fatigue: Secondary | ICD-10-CM | POA: Diagnosis not present

## 2021-11-08 DIAGNOSIS — Z79899 Other long term (current) drug therapy: Secondary | ICD-10-CM | POA: Diagnosis not present

## 2021-11-08 DIAGNOSIS — D649 Anemia, unspecified: Secondary | ICD-10-CM

## 2021-11-08 LAB — OCCULT BLOOD X 1 CARD TO LAB, STOOL
Fecal Occult Bld: NEGATIVE
Fecal Occult Bld: NEGATIVE
Fecal Occult Bld: NEGATIVE

## 2021-11-09 LAB — KAPPA/LAMBDA LIGHT CHAINS
Kappa free light chain: 105.6 mg/L — ABNORMAL HIGH (ref 3.3–19.4)
Kappa, lambda light chain ratio: 2.3 — ABNORMAL HIGH (ref 0.26–1.65)
Lambda free light chains: 45.9 mg/L — ABNORMAL HIGH (ref 5.7–26.3)

## 2021-11-10 LAB — ERYTHROPOIETIN: Erythropoietin: 8.1 m[IU]/mL (ref 2.6–18.5)

## 2021-11-14 ENCOUNTER — Other Ambulatory Visit (HOSPITAL_COMMUNITY): Payer: Self-pay | Admitting: *Deleted

## 2021-11-14 ENCOUNTER — Inpatient Hospital Stay (HOSPITAL_COMMUNITY): Payer: Medicare Other

## 2021-11-14 DIAGNOSIS — Z79899 Other long term (current) drug therapy: Secondary | ICD-10-CM | POA: Diagnosis not present

## 2021-11-14 DIAGNOSIS — D649 Anemia, unspecified: Secondary | ICD-10-CM | POA: Diagnosis not present

## 2021-11-14 DIAGNOSIS — R5383 Other fatigue: Secondary | ICD-10-CM | POA: Diagnosis not present

## 2021-11-14 DIAGNOSIS — Z801 Family history of malignant neoplasm of trachea, bronchus and lung: Secondary | ICD-10-CM | POA: Diagnosis not present

## 2021-11-14 DIAGNOSIS — N1831 Chronic kidney disease, stage 3a: Secondary | ICD-10-CM | POA: Diagnosis not present

## 2021-11-14 LAB — IMMUNOFIXATION ELECTROPHORESIS
IgA: 189 mg/dL (ref 64–422)
IgG (Immunoglobin G), Serum: 1573 mg/dL (ref 586–1602)
IgM (Immunoglobulin M), Srm: 184 mg/dL (ref 26–217)
Total Protein ELP: 7.5 g/dL (ref 6.0–8.5)

## 2021-11-15 LAB — MISC LABCORP TEST (SEND OUT): Labcorp test code: 121251

## 2021-11-17 NOTE — Progress Notes (Signed)
Virtual Visit via Telephone Note [facility]  I connected with Veronica Howard  on 11/18/21 at 2:40 PM by telephone and verified that I am speaking with the correct person using two identifiers.  Location: Patient: Home Provider: [facility]   I discussed the limitations, risks, security and privacy concerns of performing an evaluation and management service by telephone and the availability of in person appointments. I also discussed with the patient that there may be a patient responsible charge related to this service. The patient expressed understanding and agreed to proceed.  REASON FOR VISIT: Follow-up for normocytic anemia  PRIOR THERAPY: None  CURRENT THERAPY: Under work-up  INTERVAL HISTORY: Veronica Howard is contacted today for follow-up of her normocytic anemia.  She was last seen by Tarri Abernethy PA-C on 11/01/2021.  At today's visit, she reports feeling fair.  She denies any changes in her health status since her visit earlier this month.  She notices some improved energy with the Vitamin B12. She denies any signs of bleeding such as epistaxis, hematemesis, hematochezia, melena, or hematuria.   She denies any unexplained fever, chills, or night sweats.  She does report that she is slowly losing weight secondary to decreased appetite (50% appetite).  She reports ongoing fatigue with energy about 60%.    OBSERVATIONS/OBJECTIVE: Review of Systems  Constitutional:  Positive for malaise/fatigue. Negative for chills, diaphoresis, fever and weight loss.  Respiratory:  Negative for cough and shortness of breath.   Cardiovascular:  Negative for chest pain and palpitations.  Gastrointestinal:  Negative for abdominal pain, blood in stool, melena, nausea and vomiting.  Neurological:  Positive for dizziness and tingling (hands and fingers). Negative for headaches.     PHYSICAL EXAM (per limitations of virtual telephone visit): The patient is alert and  oriented x 3, exhibiting adequate mentation, good mood, and ability to speak in full sentences and execute sound judgement.   ASSESSMENT & PLAN: 1.  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 Jenetta Downer. - Colonoscopy (09/06/2021): Normal ileum with decompressed rectum extrinsically. - CTAP with contrast (09/11/2021): Multiple calcified uterine fibroids, largest 6.1 cm in the lower uterine segment region pressing adjacent rectum. - She had blood transfusion and required iron supplementation as a young adult. - Patient's daughter has anemia, but daughter's anemia is secondary to CKD/IDA rather than heritable anemia - No vaginal bleeding, prior blood per rectum, melena, epistaxis, or hematuria. - Symptomatic with fatigue.  Denies any pica, syncope, or dyspnea on exertion. - Hematology work-up (10/07/2021):   Normal SPEP and immunofixation. Elevated kappa free light chain 105.6 with elevated lambda free light chain 45.9 and elevated free light chain ratio 2.30, which could be secondary to CKD. Marland KitchenReticulocytes 2.4%, LDH 142.  Haptoglobin normal.   Normal iron (ferritin 308, iron saturation 21%, TIBC 301).  Normal copper and folate.  Marginal vitamin B12 at 199, but methylmalonic acid normal. 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 - Most recent CBC (10/07/2021): Hgb 9.2/MCV 94.0, otherwise normal CBC. - Differential diagnosis includes anemia secondary to renal dysfunction, anemia related to marginal B12 levels, or early MDS or other bone marrow infiltrative disorder. - PLAN: Continue sublingual B12 due to marginal vitamin B12 levels.  - We will recheck CBC and B12 levels  in 6 weeks - if no improvement in Hgb despite B12 supplementation, would consider starting patient on Retacrit due  to anemia in the setting of suspected CKD stage IIIa.  If no improvement after starting Retacrit, would consider bone marrow biopsy. - We will recheck free light chains, creatinine, and Cystatin C in 3-4 months in the setting of CKD stage IIIa.   2.  Social/family history: - She lives with her daughter at home.  She is able to do her ADLs.  Some ADLs are limited because of right elbow surgery.  She worked in [place].  No chemical exposure.  Non-smoker. - Brother had lung cancer.   FOLLOW UP INSTRUCTIONS: Labs in 6 weeks RTC 1 week after labs    I discussed the assessment and treatment plan with the patient. The patient was provided an opportunity to ask questions and all were answered. The patient agreed with the plan and demonstrated an understanding of the instructions.   The patient was advised to call back or seek an in-person evaluation if the symptoms worsen or if the condition fails to improve as anticipated.  I provided 17 minutes of non-face-to-face time during this encounter.   Harriett Rush, PA-C 11/18/2021 2:55 PM

## 2021-11-18 ENCOUNTER — Inpatient Hospital Stay (HOSPITAL_BASED_OUTPATIENT_CLINIC_OR_DEPARTMENT_OTHER): Payer: Medicare Other | Admitting: Physician Assistant

## 2021-11-18 DIAGNOSIS — D649 Anemia, unspecified: Secondary | ICD-10-CM

## 2021-11-18 DIAGNOSIS — D631 Anemia in chronic kidney disease: Secondary | ICD-10-CM

## 2021-11-18 DIAGNOSIS — N1831 Chronic kidney disease, stage 3a: Secondary | ICD-10-CM

## 2021-11-18 DIAGNOSIS — E538 Deficiency of other specified B group vitamins: Secondary | ICD-10-CM | POA: Diagnosis not present

## 2021-12-22 DIAGNOSIS — M818 Other osteoporosis without current pathological fracture: Secondary | ICD-10-CM | POA: Diagnosis not present

## 2021-12-22 DIAGNOSIS — I10 Essential (primary) hypertension: Secondary | ICD-10-CM | POA: Diagnosis not present

## 2021-12-22 DIAGNOSIS — M545 Low back pain, unspecified: Secondary | ICD-10-CM | POA: Diagnosis not present

## 2021-12-22 DIAGNOSIS — Z6824 Body mass index (BMI) 24.0-24.9, adult: Secondary | ICD-10-CM | POA: Diagnosis not present

## 2021-12-22 DIAGNOSIS — M542 Cervicalgia: Secondary | ICD-10-CM | POA: Diagnosis not present

## 2021-12-22 DIAGNOSIS — Z0000 Encounter for general adult medical examination without abnormal findings: Secondary | ICD-10-CM | POA: Diagnosis not present

## 2021-12-22 DIAGNOSIS — E611 Iron deficiency: Secondary | ICD-10-CM | POA: Diagnosis not present

## 2021-12-22 DIAGNOSIS — Z6823 Body mass index (BMI) 23.0-23.9, adult: Secondary | ICD-10-CM | POA: Diagnosis not present

## 2021-12-22 DIAGNOSIS — M0689 Other specified rheumatoid arthritis, multiple sites: Secondary | ICD-10-CM | POA: Diagnosis not present

## 2021-12-28 ENCOUNTER — Inpatient Hospital Stay: Payer: Medicare Other

## 2021-12-30 ENCOUNTER — Other Ambulatory Visit (HOSPITAL_COMMUNITY): Payer: Medicare Other

## 2022-01-06 ENCOUNTER — Ambulatory Visit: Payer: Medicare Other | Admitting: Physician Assistant

## 2022-01-19 DIAGNOSIS — S53104S Unspecified dislocation of right ulnohumeral joint, sequela: Secondary | ICD-10-CM | POA: Diagnosis not present

## 2022-01-19 DIAGNOSIS — Z471 Aftercare following joint replacement surgery: Secondary | ICD-10-CM | POA: Diagnosis not present

## 2022-01-19 DIAGNOSIS — M8588 Other specified disorders of bone density and structure, other site: Secondary | ICD-10-CM | POA: Diagnosis not present

## 2022-01-19 DIAGNOSIS — Z96621 Presence of right artificial elbow joint: Secondary | ICD-10-CM | POA: Diagnosis not present

## 2022-03-02 DIAGNOSIS — H353131 Nonexudative age-related macular degeneration, bilateral, early dry stage: Secondary | ICD-10-CM | POA: Diagnosis not present

## 2022-03-23 DIAGNOSIS — M0689 Other specified rheumatoid arthritis, multiple sites: Secondary | ICD-10-CM | POA: Diagnosis not present

## 2022-03-23 DIAGNOSIS — Z6823 Body mass index (BMI) 23.0-23.9, adult: Secondary | ICD-10-CM | POA: Diagnosis not present

## 2022-03-23 DIAGNOSIS — M818 Other osteoporosis without current pathological fracture: Secondary | ICD-10-CM | POA: Diagnosis not present

## 2022-03-23 DIAGNOSIS — I10 Essential (primary) hypertension: Secondary | ICD-10-CM | POA: Diagnosis not present

## 2022-03-23 DIAGNOSIS — M542 Cervicalgia: Secondary | ICD-10-CM | POA: Diagnosis not present

## 2022-03-23 DIAGNOSIS — M545 Low back pain, unspecified: Secondary | ICD-10-CM | POA: Diagnosis not present

## 2022-03-23 DIAGNOSIS — E611 Iron deficiency: Secondary | ICD-10-CM | POA: Diagnosis not present

## 2022-04-24 ENCOUNTER — Other Ambulatory Visit: Payer: Medicare Other

## 2022-04-24 ENCOUNTER — Ambulatory Visit: Payer: Medicare Other | Admitting: Obstetrics & Gynecology

## 2022-06-29 DIAGNOSIS — Z0000 Encounter for general adult medical examination without abnormal findings: Secondary | ICD-10-CM | POA: Diagnosis not present

## 2022-06-29 DIAGNOSIS — M545 Low back pain, unspecified: Secondary | ICD-10-CM | POA: Diagnosis not present

## 2022-06-29 DIAGNOSIS — M0689 Other specified rheumatoid arthritis, multiple sites: Secondary | ICD-10-CM | POA: Diagnosis not present

## 2022-06-29 DIAGNOSIS — I10 Essential (primary) hypertension: Secondary | ICD-10-CM | POA: Diagnosis not present

## 2022-06-29 DIAGNOSIS — Z6823 Body mass index (BMI) 23.0-23.9, adult: Secondary | ICD-10-CM | POA: Diagnosis not present

## 2022-06-29 DIAGNOSIS — M818 Other osteoporosis without current pathological fracture: Secondary | ICD-10-CM | POA: Diagnosis not present

## 2022-06-29 DIAGNOSIS — E611 Iron deficiency: Secondary | ICD-10-CM | POA: Diagnosis not present

## 2022-08-19 DIAGNOSIS — R0602 Shortness of breath: Secondary | ICD-10-CM | POA: Diagnosis not present

## 2022-08-19 DIAGNOSIS — Z7951 Long term (current) use of inhaled steroids: Secondary | ICD-10-CM | POA: Diagnosis not present

## 2022-08-19 DIAGNOSIS — R059 Cough, unspecified: Secondary | ICD-10-CM | POA: Diagnosis not present

## 2022-08-19 DIAGNOSIS — R42 Dizziness and giddiness: Secondary | ICD-10-CM | POA: Diagnosis not present

## 2022-08-19 DIAGNOSIS — Z886 Allergy status to analgesic agent status: Secondary | ICD-10-CM | POA: Diagnosis not present

## 2022-08-19 DIAGNOSIS — J441 Chronic obstructive pulmonary disease with (acute) exacerbation: Secondary | ICD-10-CM | POA: Diagnosis not present

## 2022-08-19 DIAGNOSIS — R062 Wheezing: Secondary | ICD-10-CM | POA: Diagnosis not present

## 2022-08-19 DIAGNOSIS — I10 Essential (primary) hypertension: Secondary | ICD-10-CM | POA: Diagnosis not present

## 2022-08-19 DIAGNOSIS — U071 COVID-19: Secondary | ICD-10-CM | POA: Diagnosis not present

## 2022-08-19 DIAGNOSIS — M069 Rheumatoid arthritis, unspecified: Secondary | ICD-10-CM | POA: Diagnosis not present

## 2022-08-31 DIAGNOSIS — M25511 Pain in right shoulder: Secondary | ICD-10-CM | POA: Diagnosis not present

## 2022-08-31 DIAGNOSIS — Z6823 Body mass index (BMI) 23.0-23.9, adult: Secondary | ICD-10-CM | POA: Diagnosis not present

## 2022-08-31 DIAGNOSIS — I10 Essential (primary) hypertension: Secondary | ICD-10-CM | POA: Diagnosis not present

## 2022-08-31 DIAGNOSIS — U071 COVID-19: Secondary | ICD-10-CM | POA: Diagnosis not present

## 2022-09-05 DIAGNOSIS — H353131 Nonexudative age-related macular degeneration, bilateral, early dry stage: Secondary | ICD-10-CM | POA: Diagnosis not present

## 2022-09-05 DIAGNOSIS — H524 Presbyopia: Secondary | ICD-10-CM | POA: Diagnosis not present

## 2022-09-21 DIAGNOSIS — Z6822 Body mass index (BMI) 22.0-22.9, adult: Secondary | ICD-10-CM | POA: Diagnosis not present

## 2022-09-21 DIAGNOSIS — M069 Rheumatoid arthritis, unspecified: Secondary | ICD-10-CM | POA: Diagnosis not present

## 2022-09-21 DIAGNOSIS — E611 Iron deficiency: Secondary | ICD-10-CM | POA: Diagnosis not present

## 2022-09-21 DIAGNOSIS — I10 Essential (primary) hypertension: Secondary | ICD-10-CM | POA: Diagnosis not present

## 2022-09-21 DIAGNOSIS — M5459 Other low back pain: Secondary | ICD-10-CM | POA: Diagnosis not present

## 2022-09-21 DIAGNOSIS — Z0000 Encounter for general adult medical examination without abnormal findings: Secondary | ICD-10-CM | POA: Diagnosis not present

## 2022-12-20 DIAGNOSIS — M069 Rheumatoid arthritis, unspecified: Secondary | ICD-10-CM | POA: Diagnosis not present

## 2022-12-20 DIAGNOSIS — Z6821 Body mass index (BMI) 21.0-21.9, adult: Secondary | ICD-10-CM | POA: Diagnosis not present

## 2022-12-20 DIAGNOSIS — E611 Iron deficiency: Secondary | ICD-10-CM | POA: Diagnosis not present

## 2022-12-20 DIAGNOSIS — I10 Essential (primary) hypertension: Secondary | ICD-10-CM | POA: Diagnosis not present

## 2022-12-20 DIAGNOSIS — M5459 Other low back pain: Secondary | ICD-10-CM | POA: Diagnosis not present

## 2023-01-25 DIAGNOSIS — Z4789 Encounter for other orthopedic aftercare: Secondary | ICD-10-CM | POA: Diagnosis not present

## 2023-01-25 DIAGNOSIS — Z96621 Presence of right artificial elbow joint: Secondary | ICD-10-CM | POA: Diagnosis not present

## 2023-01-25 DIAGNOSIS — Z471 Aftercare following joint replacement surgery: Secondary | ICD-10-CM | POA: Diagnosis not present

## 2023-02-21 DIAGNOSIS — I10 Essential (primary) hypertension: Secondary | ICD-10-CM | POA: Diagnosis not present

## 2023-02-21 DIAGNOSIS — M069 Rheumatoid arthritis, unspecified: Secondary | ICD-10-CM | POA: Diagnosis not present

## 2023-02-21 DIAGNOSIS — M5459 Other low back pain: Secondary | ICD-10-CM | POA: Diagnosis not present

## 2023-02-21 DIAGNOSIS — E611 Iron deficiency: Secondary | ICD-10-CM | POA: Diagnosis not present

## 2023-02-21 DIAGNOSIS — Z6821 Body mass index (BMI) 21.0-21.9, adult: Secondary | ICD-10-CM | POA: Diagnosis not present

## 2023-03-08 DIAGNOSIS — M545 Low back pain, unspecified: Secondary | ICD-10-CM | POA: Diagnosis not present

## 2023-03-08 DIAGNOSIS — M47816 Spondylosis without myelopathy or radiculopathy, lumbar region: Secondary | ICD-10-CM | POA: Diagnosis not present

## 2023-03-08 DIAGNOSIS — M4316 Spondylolisthesis, lumbar region: Secondary | ICD-10-CM | POA: Diagnosis not present

## 2023-03-08 DIAGNOSIS — M069 Rheumatoid arthritis, unspecified: Secondary | ICD-10-CM | POA: Diagnosis not present

## 2023-03-08 DIAGNOSIS — I10 Essential (primary) hypertension: Secondary | ICD-10-CM | POA: Diagnosis not present

## 2023-03-08 DIAGNOSIS — M549 Dorsalgia, unspecified: Secondary | ICD-10-CM | POA: Diagnosis not present

## 2023-03-08 DIAGNOSIS — M5136 Other intervertebral disc degeneration, lumbar region with discogenic back pain only: Secondary | ICD-10-CM | POA: Diagnosis not present

## 2023-03-12 DIAGNOSIS — M5430 Sciatica, unspecified side: Secondary | ICD-10-CM | POA: Diagnosis not present

## 2023-03-12 DIAGNOSIS — Z6821 Body mass index (BMI) 21.0-21.9, adult: Secondary | ICD-10-CM | POA: Diagnosis not present

## 2023-03-12 DIAGNOSIS — I10 Essential (primary) hypertension: Secondary | ICD-10-CM | POA: Diagnosis not present

## 2023-03-18 DIAGNOSIS — M25551 Pain in right hip: Secondary | ICD-10-CM | POA: Diagnosis not present

## 2023-03-22 DIAGNOSIS — M5459 Other low back pain: Secondary | ICD-10-CM | POA: Diagnosis not present

## 2023-03-22 DIAGNOSIS — M47814 Spondylosis without myelopathy or radiculopathy, thoracic region: Secondary | ICD-10-CM | POA: Diagnosis not present

## 2023-03-22 DIAGNOSIS — Z6821 Body mass index (BMI) 21.0-21.9, adult: Secondary | ICD-10-CM | POA: Diagnosis not present

## 2023-03-22 DIAGNOSIS — M5136 Other intervertebral disc degeneration, lumbar region with discogenic back pain only: Secondary | ICD-10-CM | POA: Diagnosis not present

## 2023-03-22 DIAGNOSIS — M5430 Sciatica, unspecified side: Secondary | ICD-10-CM | POA: Diagnosis not present

## 2023-03-22 DIAGNOSIS — M4316 Spondylolisthesis, lumbar region: Secondary | ICD-10-CM | POA: Diagnosis not present

## 2023-03-22 DIAGNOSIS — I10 Essential (primary) hypertension: Secondary | ICD-10-CM | POA: Diagnosis not present

## 2023-03-22 DIAGNOSIS — M5134 Other intervertebral disc degeneration, thoracic region: Secondary | ICD-10-CM | POA: Diagnosis not present

## 2023-03-22 DIAGNOSIS — M47816 Spondylosis without myelopathy or radiculopathy, lumbar region: Secondary | ICD-10-CM | POA: Diagnosis not present

## 2023-03-22 DIAGNOSIS — R1900 Intra-abdominal and pelvic swelling, mass and lump, unspecified site: Secondary | ICD-10-CM | POA: Diagnosis not present

## 2023-03-28 DIAGNOSIS — M4804 Spinal stenosis, thoracic region: Secondary | ICD-10-CM | POA: Diagnosis not present

## 2023-03-28 DIAGNOSIS — M546 Pain in thoracic spine: Secondary | ICD-10-CM | POA: Diagnosis not present

## 2023-03-28 DIAGNOSIS — M47816 Spondylosis without myelopathy or radiculopathy, lumbar region: Secondary | ICD-10-CM | POA: Diagnosis not present

## 2023-03-28 DIAGNOSIS — M4807 Spinal stenosis, lumbosacral region: Secondary | ICD-10-CM | POA: Diagnosis not present

## 2023-03-28 DIAGNOSIS — M47817 Spondylosis without myelopathy or radiculopathy, lumbosacral region: Secondary | ICD-10-CM | POA: Diagnosis not present

## 2023-03-28 DIAGNOSIS — M47814 Spondylosis without myelopathy or radiculopathy, thoracic region: Secondary | ICD-10-CM | POA: Diagnosis not present

## 2023-03-28 DIAGNOSIS — M549 Dorsalgia, unspecified: Secondary | ICD-10-CM | POA: Diagnosis not present

## 2023-03-28 DIAGNOSIS — M48061 Spinal stenosis, lumbar region without neurogenic claudication: Secondary | ICD-10-CM | POA: Diagnosis not present

## 2023-03-28 DIAGNOSIS — M545 Low back pain, unspecified: Secondary | ICD-10-CM | POA: Diagnosis not present

## 2023-04-24 DIAGNOSIS — M48062 Spinal stenosis, lumbar region with neurogenic claudication: Secondary | ICD-10-CM | POA: Diagnosis not present

## 2023-04-24 DIAGNOSIS — M5416 Radiculopathy, lumbar region: Secondary | ICD-10-CM | POA: Diagnosis not present

## 2023-04-24 DIAGNOSIS — M5126 Other intervertebral disc displacement, lumbar region: Secondary | ICD-10-CM | POA: Diagnosis not present

## 2023-05-15 DIAGNOSIS — M48061 Spinal stenosis, lumbar region without neurogenic claudication: Secondary | ICD-10-CM | POA: Diagnosis not present

## 2023-05-15 DIAGNOSIS — M5417 Radiculopathy, lumbosacral region: Secondary | ICD-10-CM | POA: Diagnosis not present

## 2023-05-24 DIAGNOSIS — Z6821 Body mass index (BMI) 21.0-21.9, adult: Secondary | ICD-10-CM | POA: Diagnosis not present

## 2023-05-24 DIAGNOSIS — E611 Iron deficiency: Secondary | ICD-10-CM | POA: Diagnosis not present

## 2023-05-24 DIAGNOSIS — I10 Essential (primary) hypertension: Secondary | ICD-10-CM | POA: Diagnosis not present

## 2023-05-24 DIAGNOSIS — M5430 Sciatica, unspecified side: Secondary | ICD-10-CM | POA: Diagnosis not present

## 2023-05-24 DIAGNOSIS — Z0000 Encounter for general adult medical examination without abnormal findings: Secondary | ICD-10-CM | POA: Diagnosis not present

## 2023-05-24 DIAGNOSIS — M5459 Other low back pain: Secondary | ICD-10-CM | POA: Diagnosis not present

## 2023-05-24 DIAGNOSIS — M069 Rheumatoid arthritis, unspecified: Secondary | ICD-10-CM | POA: Diagnosis not present

## 2023-06-05 DIAGNOSIS — M48061 Spinal stenosis, lumbar region without neurogenic claudication: Secondary | ICD-10-CM | POA: Diagnosis not present

## 2023-06-17 IMAGING — CT CT ABD-PELV W/ CM
2 of 5 series · 17 of 46 positions shown, 19 images · IV contrast (Omnipaque or Isovue)
Comparison: None.

CLINICAL DATA: Mass compressing rectum

EXAM:
CT ABDOMEN AND PELVIS WITH CONTRAST
TECHNIQUE: Multidetector CT imaging of the abdomen and pelvis was performed
using the standard protocol following bolus administration of
intravenous contrast.

[Series 2: axial st · axial · 0.85mm/px · z∈[+838,+1213]mm · 14 of 85 slices shown, 16 images]
[im 5/85  soft-tissue]
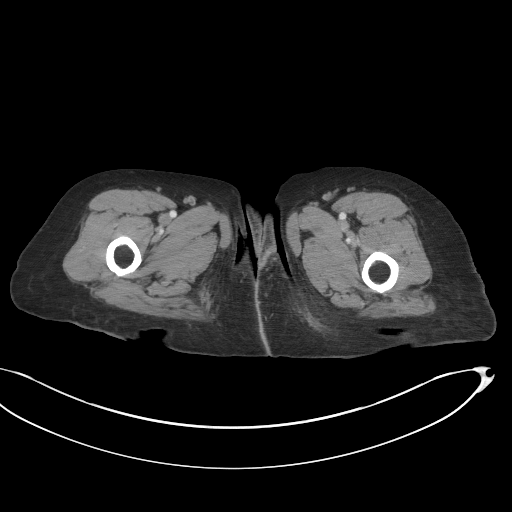
[im 5/85  bone]
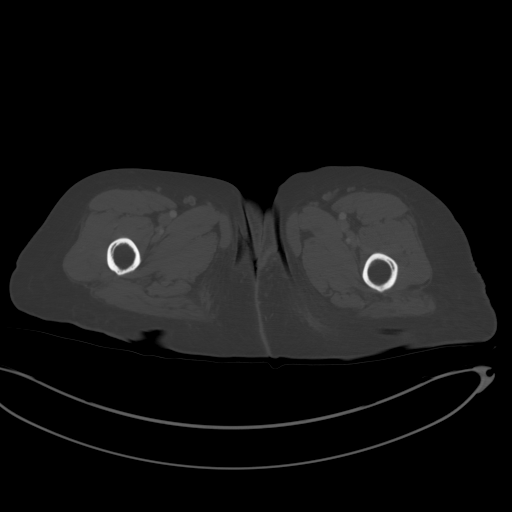
[im 13/85  soft-tissue]
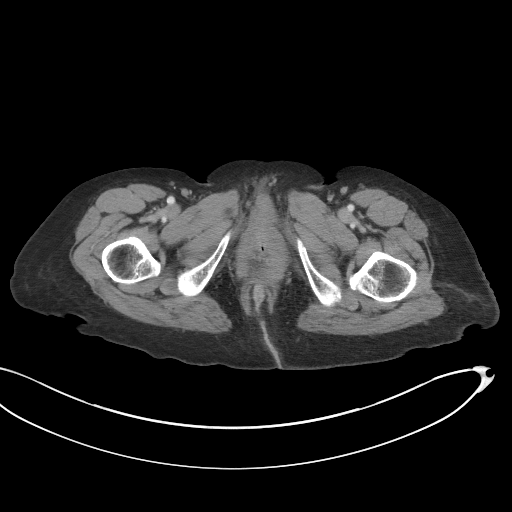
[im 17/85  soft-tissue]
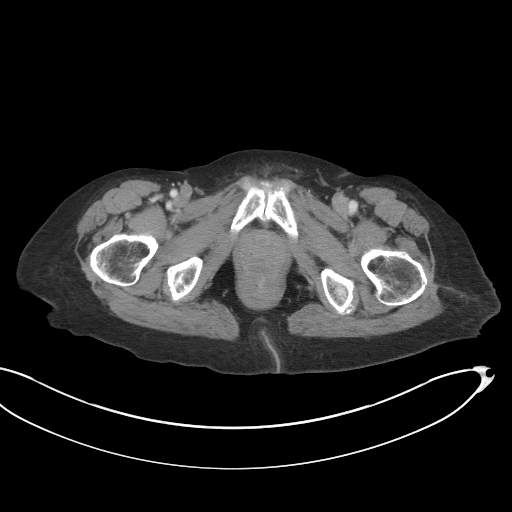
[im 22/85  soft-tissue]
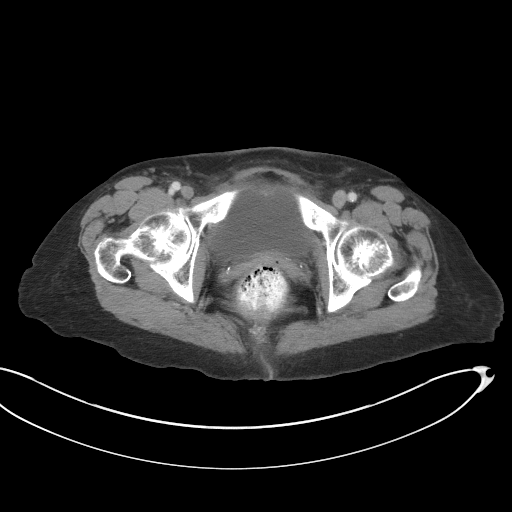
[im 30/85  soft-tissue]
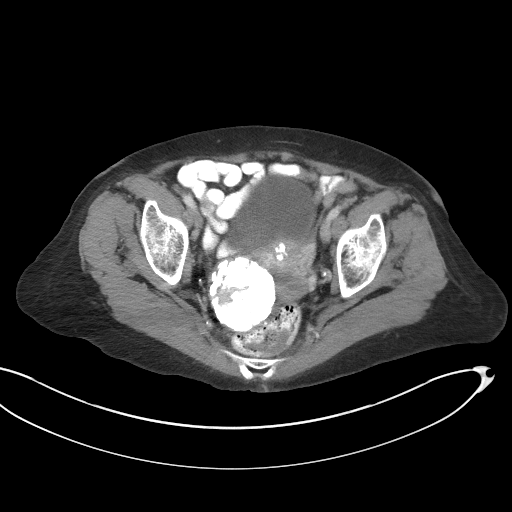
[im 34/85  soft-tissue]
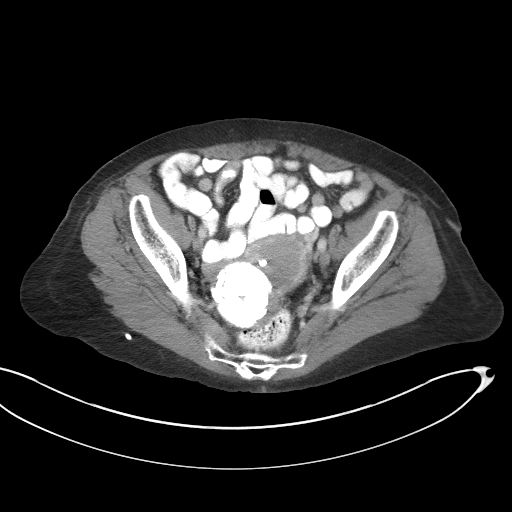
[im 38/85  soft-tissue]
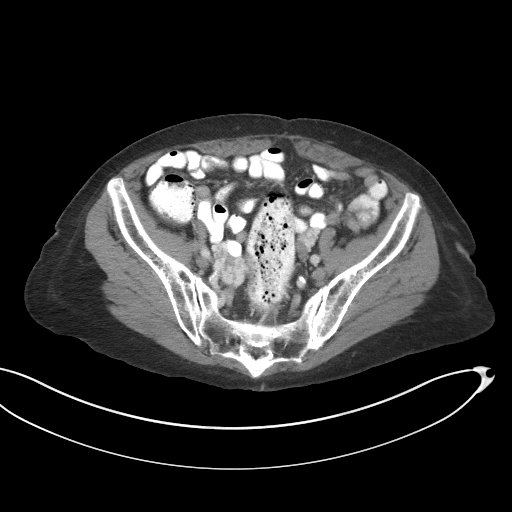
[im 47/85  soft-tissue]
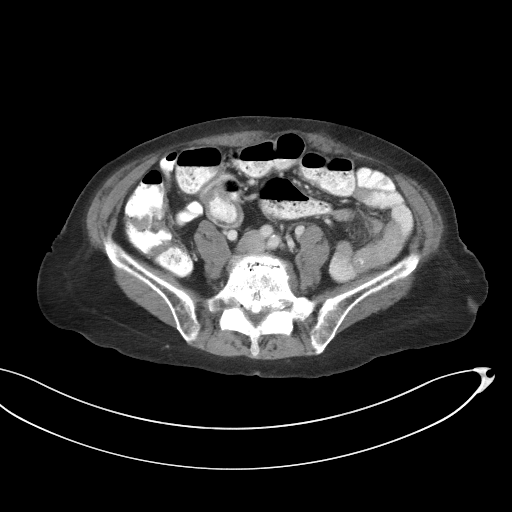
[im 51/85  soft-tissue]
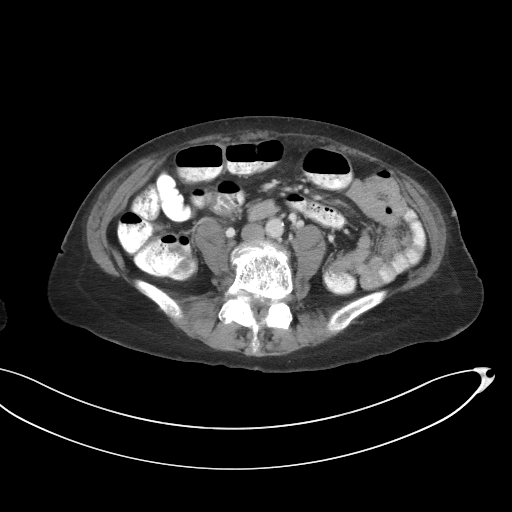
[im 51/85  bone]
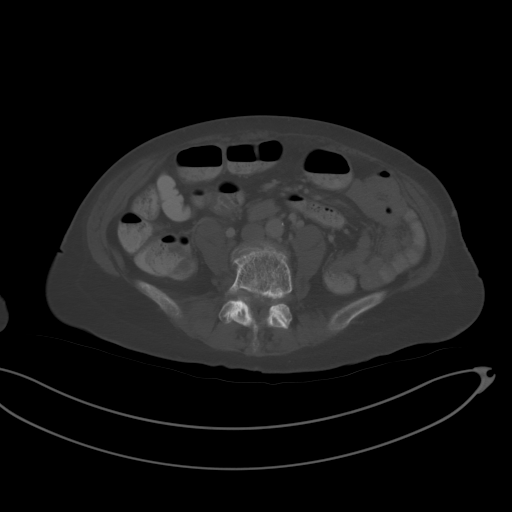
[im 55/85  soft-tissue]
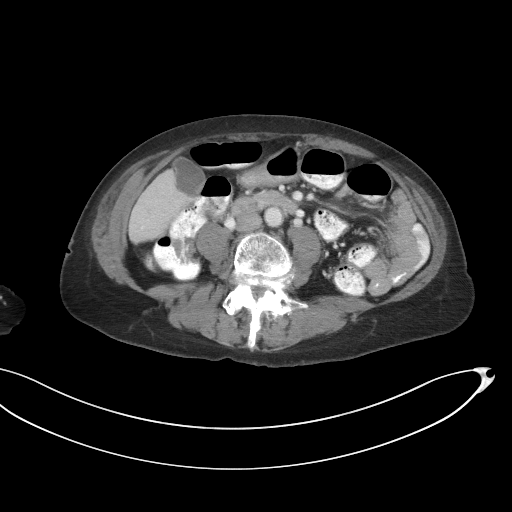
[im 64/85  soft-tissue]
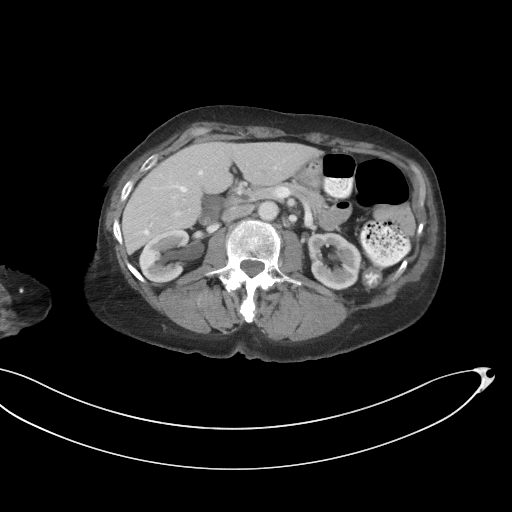
[im 68/85  soft-tissue]
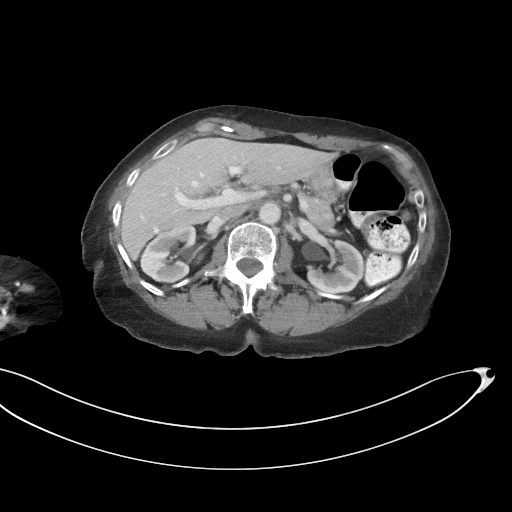
[im 72/85  soft-tissue]
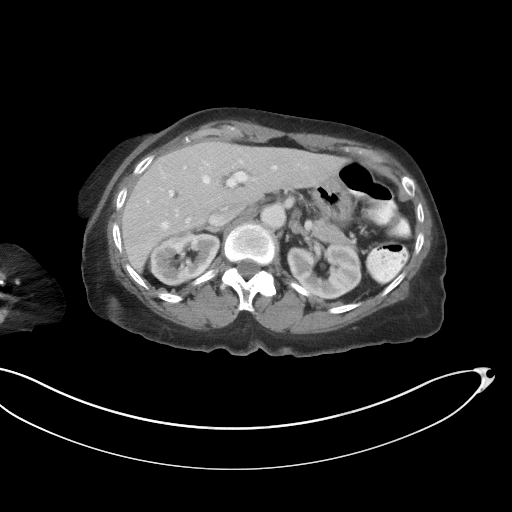
[im 80/85  soft-tissue]
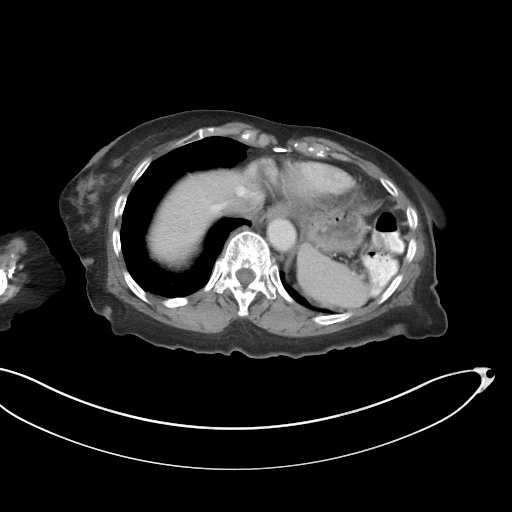

[Series 5: coronal st · coronal · 0.82mm/px · 3 of 92 slices shown]
[im 31/92  soft-tissue]
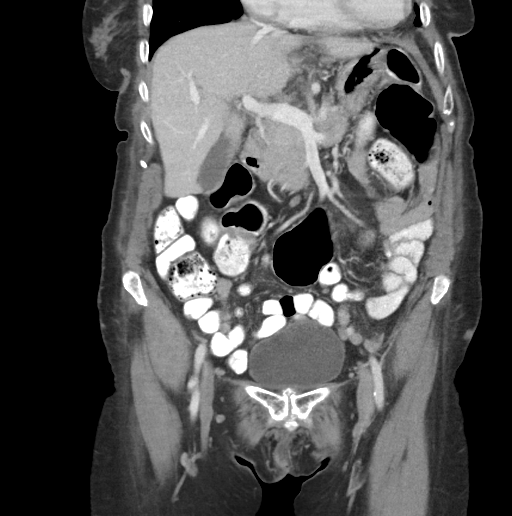
[im 41/92  soft-tissue]
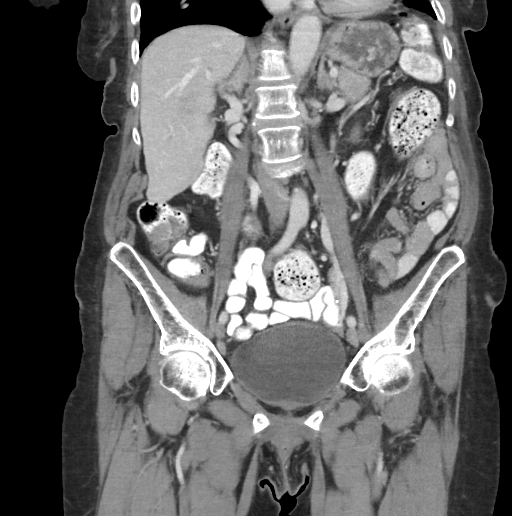
[im 51/92  soft-tissue]
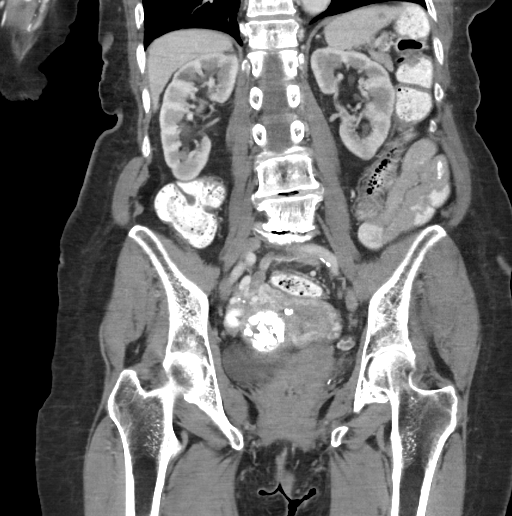

[17 of 46 positions shown; findings below may reference images not displayed]

RADIATION DOSE REDUCTION: This exam was performed according to the
departmental dose-optimization program which includes automated
exposure control, adjustment of the mA and/or kV according to
patient size and/or use of iterative reconstruction technique.

CONTRAST:  100mL OMNIPAQUE IOHEXOL 300 MG/ML  SOLN
FINDINGS: Lower chest: No acute abnormality

Hepatobiliary: No focal hepatic abnormality. Gallbladder
unremarkable.

Pancreas: No focal abnormality or ductal dilatation.

Spleen: No focal abnormality.  Normal size.

Adrenals/Urinary Tract: No adrenal abnormality. No focal renal
abnormality. No stones or hydronephrosis. Urinary bladder is
unremarkable.

Stomach/Bowel: There is extrinsic compression on the anterior right
rectum by a large calcified fibroid which measures up to 6.1 cm.
Stomach, large and small bowel grossly unremarkable.

Vascular/Lymphatic: Aortic atherosclerosis. No evidence of aneurysm
or adenopathy.

Reproductive: Multiple calcified uterine fibroids, the largest
cm and pressing on the anterior right portion of the rectum as
described above.

Other: No free fluid or free air.

Musculoskeletal: No acute bony abnormality.
IMPRESSION: Multiple calcified uterine fibroids, the largest 6.1 cm in the lower
uterine segment region which presses on the adjacent rectum.

No bowel abnormality.

No acute findings.

## 2023-08-23 DIAGNOSIS — E611 Iron deficiency: Secondary | ICD-10-CM | POA: Diagnosis not present

## 2023-08-23 DIAGNOSIS — M069 Rheumatoid arthritis, unspecified: Secondary | ICD-10-CM | POA: Diagnosis not present

## 2023-08-23 DIAGNOSIS — M5430 Sciatica, unspecified side: Secondary | ICD-10-CM | POA: Diagnosis not present

## 2023-08-23 DIAGNOSIS — M5459 Other low back pain: Secondary | ICD-10-CM | POA: Diagnosis not present

## 2023-08-23 DIAGNOSIS — I10 Essential (primary) hypertension: Secondary | ICD-10-CM | POA: Diagnosis not present

## 2023-08-23 DIAGNOSIS — M25719 Osteophyte, unspecified shoulder: Secondary | ICD-10-CM | POA: Diagnosis not present

## 2023-08-23 DIAGNOSIS — Z6822 Body mass index (BMI) 22.0-22.9, adult: Secondary | ICD-10-CM | POA: Diagnosis not present

## 2023-08-23 DIAGNOSIS — Z0000 Encounter for general adult medical examination without abnormal findings: Secondary | ICD-10-CM | POA: Diagnosis not present

## 2023-08-28 DIAGNOSIS — Z780 Asymptomatic menopausal state: Secondary | ICD-10-CM | POA: Diagnosis not present

## 2023-08-28 DIAGNOSIS — M810 Age-related osteoporosis without current pathological fracture: Secondary | ICD-10-CM | POA: Diagnosis not present

## 2023-09-17 DIAGNOSIS — H353131 Nonexudative age-related macular degeneration, bilateral, early dry stage: Secondary | ICD-10-CM | POA: Diagnosis not present

## 2023-09-18 DIAGNOSIS — M810 Age-related osteoporosis without current pathological fracture: Secondary | ICD-10-CM | POA: Diagnosis not present

## 2023-10-17 DIAGNOSIS — M25562 Pain in left knee: Secondary | ICD-10-CM | POA: Diagnosis not present

## 2023-10-17 DIAGNOSIS — M79605 Pain in left leg: Secondary | ICD-10-CM | POA: Diagnosis not present

## 2023-10-17 DIAGNOSIS — Z743 Need for continuous supervision: Secondary | ICD-10-CM | POA: Diagnosis not present

## 2023-10-17 DIAGNOSIS — M25552 Pain in left hip: Secondary | ICD-10-CM | POA: Diagnosis not present

## 2023-10-17 DIAGNOSIS — Z96652 Presence of left artificial knee joint: Secondary | ICD-10-CM | POA: Diagnosis not present

## 2023-10-17 DIAGNOSIS — M1612 Unilateral primary osteoarthritis, left hip: Secondary | ICD-10-CM | POA: Diagnosis not present

## 2023-10-17 DIAGNOSIS — Z471 Aftercare following joint replacement surgery: Secondary | ICD-10-CM | POA: Diagnosis not present

## 2023-10-17 DIAGNOSIS — I10 Essential (primary) hypertension: Secondary | ICD-10-CM | POA: Diagnosis not present

## 2023-10-17 DIAGNOSIS — M25462 Effusion, left knee: Secondary | ICD-10-CM | POA: Diagnosis not present

## 2023-10-17 DIAGNOSIS — M79662 Pain in left lower leg: Secondary | ICD-10-CM | POA: Diagnosis not present

## 2023-10-17 DIAGNOSIS — R531 Weakness: Secondary | ICD-10-CM | POA: Diagnosis not present

## 2023-10-17 DIAGNOSIS — R609 Edema, unspecified: Secondary | ICD-10-CM | POA: Diagnosis not present

## 2023-10-19 DIAGNOSIS — M810 Age-related osteoporosis without current pathological fracture: Secondary | ICD-10-CM | POA: Diagnosis not present

## 2023-10-19 DIAGNOSIS — Z79891 Long term (current) use of opiate analgesic: Secondary | ICD-10-CM | POA: Diagnosis not present

## 2023-10-19 DIAGNOSIS — M79662 Pain in left lower leg: Secondary | ICD-10-CM | POA: Diagnosis not present

## 2023-10-19 DIAGNOSIS — M1612 Unilateral primary osteoarthritis, left hip: Secondary | ICD-10-CM | POA: Diagnosis not present

## 2023-10-19 DIAGNOSIS — I10 Essential (primary) hypertension: Secondary | ICD-10-CM | POA: Diagnosis not present

## 2023-10-20 DIAGNOSIS — M810 Age-related osteoporosis without current pathological fracture: Secondary | ICD-10-CM | POA: Diagnosis not present

## 2023-10-20 DIAGNOSIS — Z79891 Long term (current) use of opiate analgesic: Secondary | ICD-10-CM | POA: Diagnosis not present

## 2023-10-20 DIAGNOSIS — M79662 Pain in left lower leg: Secondary | ICD-10-CM | POA: Diagnosis not present

## 2023-10-20 DIAGNOSIS — M1612 Unilateral primary osteoarthritis, left hip: Secondary | ICD-10-CM | POA: Diagnosis not present

## 2023-10-20 DIAGNOSIS — I10 Essential (primary) hypertension: Secondary | ICD-10-CM | POA: Diagnosis not present

## 2023-10-23 DIAGNOSIS — M79662 Pain in left lower leg: Secondary | ICD-10-CM | POA: Diagnosis not present

## 2023-10-23 DIAGNOSIS — Z79891 Long term (current) use of opiate analgesic: Secondary | ICD-10-CM | POA: Diagnosis not present

## 2023-10-23 DIAGNOSIS — I10 Essential (primary) hypertension: Secondary | ICD-10-CM | POA: Diagnosis not present

## 2023-10-23 DIAGNOSIS — M810 Age-related osteoporosis without current pathological fracture: Secondary | ICD-10-CM | POA: Diagnosis not present

## 2023-10-23 DIAGNOSIS — M1612 Unilateral primary osteoarthritis, left hip: Secondary | ICD-10-CM | POA: Diagnosis not present

## 2023-10-29 DIAGNOSIS — M79662 Pain in left lower leg: Secondary | ICD-10-CM | POA: Diagnosis not present

## 2023-10-29 DIAGNOSIS — Z79891 Long term (current) use of opiate analgesic: Secondary | ICD-10-CM | POA: Diagnosis not present

## 2023-10-29 DIAGNOSIS — M1612 Unilateral primary osteoarthritis, left hip: Secondary | ICD-10-CM | POA: Diagnosis not present

## 2023-10-29 DIAGNOSIS — M810 Age-related osteoporosis without current pathological fracture: Secondary | ICD-10-CM | POA: Diagnosis not present

## 2023-10-29 DIAGNOSIS — I10 Essential (primary) hypertension: Secondary | ICD-10-CM | POA: Diagnosis not present

## 2023-10-31 DIAGNOSIS — M810 Age-related osteoporosis without current pathological fracture: Secondary | ICD-10-CM | POA: Diagnosis not present

## 2023-10-31 DIAGNOSIS — Z79891 Long term (current) use of opiate analgesic: Secondary | ICD-10-CM | POA: Diagnosis not present

## 2023-10-31 DIAGNOSIS — M79662 Pain in left lower leg: Secondary | ICD-10-CM | POA: Diagnosis not present

## 2023-10-31 DIAGNOSIS — M1612 Unilateral primary osteoarthritis, left hip: Secondary | ICD-10-CM | POA: Diagnosis not present

## 2023-10-31 DIAGNOSIS — I10 Essential (primary) hypertension: Secondary | ICD-10-CM | POA: Diagnosis not present

## 2023-11-05 DIAGNOSIS — M1612 Unilateral primary osteoarthritis, left hip: Secondary | ICD-10-CM | POA: Diagnosis not present

## 2023-11-05 DIAGNOSIS — M810 Age-related osteoporosis without current pathological fracture: Secondary | ICD-10-CM | POA: Diagnosis not present

## 2023-11-05 DIAGNOSIS — M79662 Pain in left lower leg: Secondary | ICD-10-CM | POA: Diagnosis not present

## 2023-11-05 DIAGNOSIS — Z79891 Long term (current) use of opiate analgesic: Secondary | ICD-10-CM | POA: Diagnosis not present

## 2023-11-05 DIAGNOSIS — I10 Essential (primary) hypertension: Secondary | ICD-10-CM | POA: Diagnosis not present

## 2023-11-13 DIAGNOSIS — I10 Essential (primary) hypertension: Secondary | ICD-10-CM | POA: Diagnosis not present

## 2023-11-13 DIAGNOSIS — Z79891 Long term (current) use of opiate analgesic: Secondary | ICD-10-CM | POA: Diagnosis not present

## 2023-11-13 DIAGNOSIS — M810 Age-related osteoporosis without current pathological fracture: Secondary | ICD-10-CM | POA: Diagnosis not present

## 2023-11-13 DIAGNOSIS — M79662 Pain in left lower leg: Secondary | ICD-10-CM | POA: Diagnosis not present

## 2023-11-13 DIAGNOSIS — M1612 Unilateral primary osteoarthritis, left hip: Secondary | ICD-10-CM | POA: Diagnosis not present

## 2023-11-20 DIAGNOSIS — M1612 Unilateral primary osteoarthritis, left hip: Secondary | ICD-10-CM | POA: Diagnosis not present

## 2023-11-20 DIAGNOSIS — M810 Age-related osteoporosis without current pathological fracture: Secondary | ICD-10-CM | POA: Diagnosis not present

## 2023-11-20 DIAGNOSIS — Z79891 Long term (current) use of opiate analgesic: Secondary | ICD-10-CM | POA: Diagnosis not present

## 2023-11-20 DIAGNOSIS — M79662 Pain in left lower leg: Secondary | ICD-10-CM | POA: Diagnosis not present

## 2023-11-20 DIAGNOSIS — I10 Essential (primary) hypertension: Secondary | ICD-10-CM | POA: Diagnosis not present

## 2023-11-21 DIAGNOSIS — M25719 Osteophyte, unspecified shoulder: Secondary | ICD-10-CM | POA: Diagnosis not present

## 2023-11-21 DIAGNOSIS — E611 Iron deficiency: Secondary | ICD-10-CM | POA: Diagnosis not present

## 2023-11-21 DIAGNOSIS — Z6822 Body mass index (BMI) 22.0-22.9, adult: Secondary | ICD-10-CM | POA: Diagnosis not present

## 2023-11-21 DIAGNOSIS — M069 Rheumatoid arthritis, unspecified: Secondary | ICD-10-CM | POA: Diagnosis not present

## 2023-11-21 DIAGNOSIS — N1831 Chronic kidney disease, stage 3a: Secondary | ICD-10-CM | POA: Diagnosis not present

## 2023-11-21 DIAGNOSIS — I10 Essential (primary) hypertension: Secondary | ICD-10-CM | POA: Diagnosis not present

## 2023-11-21 DIAGNOSIS — M5430 Sciatica, unspecified side: Secondary | ICD-10-CM | POA: Diagnosis not present

## 2023-11-21 DIAGNOSIS — J4540 Moderate persistent asthma, uncomplicated: Secondary | ICD-10-CM | POA: Diagnosis not present

## 2023-11-21 DIAGNOSIS — M5459 Other low back pain: Secondary | ICD-10-CM | POA: Diagnosis not present

## 2023-11-27 DIAGNOSIS — Z79891 Long term (current) use of opiate analgesic: Secondary | ICD-10-CM | POA: Diagnosis not present

## 2023-11-27 DIAGNOSIS — M810 Age-related osteoporosis without current pathological fracture: Secondary | ICD-10-CM | POA: Diagnosis not present

## 2023-11-27 DIAGNOSIS — M79662 Pain in left lower leg: Secondary | ICD-10-CM | POA: Diagnosis not present

## 2023-11-27 DIAGNOSIS — I10 Essential (primary) hypertension: Secondary | ICD-10-CM | POA: Diagnosis not present

## 2023-11-27 DIAGNOSIS — M1612 Unilateral primary osteoarthritis, left hip: Secondary | ICD-10-CM | POA: Diagnosis not present

## 2023-12-07 DIAGNOSIS — I10 Essential (primary) hypertension: Secondary | ICD-10-CM | POA: Diagnosis not present

## 2023-12-07 DIAGNOSIS — M810 Age-related osteoporosis without current pathological fracture: Secondary | ICD-10-CM | POA: Diagnosis not present

## 2023-12-07 DIAGNOSIS — M1612 Unilateral primary osteoarthritis, left hip: Secondary | ICD-10-CM | POA: Diagnosis not present

## 2023-12-07 DIAGNOSIS — Z79891 Long term (current) use of opiate analgesic: Secondary | ICD-10-CM | POA: Diagnosis not present

## 2023-12-07 DIAGNOSIS — M79662 Pain in left lower leg: Secondary | ICD-10-CM | POA: Diagnosis not present

## 2023-12-11 DIAGNOSIS — M79662 Pain in left lower leg: Secondary | ICD-10-CM | POA: Diagnosis not present

## 2023-12-11 DIAGNOSIS — Z79891 Long term (current) use of opiate analgesic: Secondary | ICD-10-CM | POA: Diagnosis not present

## 2023-12-11 DIAGNOSIS — I10 Essential (primary) hypertension: Secondary | ICD-10-CM | POA: Diagnosis not present

## 2023-12-11 DIAGNOSIS — M810 Age-related osteoporosis without current pathological fracture: Secondary | ICD-10-CM | POA: Diagnosis not present

## 2023-12-11 DIAGNOSIS — M1612 Unilateral primary osteoarthritis, left hip: Secondary | ICD-10-CM | POA: Diagnosis not present

## 2024-02-19 DIAGNOSIS — I10 Essential (primary) hypertension: Secondary | ICD-10-CM | POA: Diagnosis not present

## 2024-02-19 DIAGNOSIS — N1831 Chronic kidney disease, stage 3a: Secondary | ICD-10-CM | POA: Diagnosis not present

## 2024-02-21 DIAGNOSIS — J4540 Moderate persistent asthma, uncomplicated: Secondary | ICD-10-CM | POA: Diagnosis not present

## 2024-02-21 DIAGNOSIS — M25511 Pain in right shoulder: Secondary | ICD-10-CM | POA: Diagnosis not present

## 2024-02-21 DIAGNOSIS — M25719 Osteophyte, unspecified shoulder: Secondary | ICD-10-CM | POA: Diagnosis not present

## 2024-02-21 DIAGNOSIS — E611 Iron deficiency: Secondary | ICD-10-CM | POA: Diagnosis not present

## 2024-02-21 DIAGNOSIS — M5430 Sciatica, unspecified side: Secondary | ICD-10-CM | POA: Diagnosis not present

## 2024-02-21 DIAGNOSIS — M5459 Other low back pain: Secondary | ICD-10-CM | POA: Diagnosis not present

## 2024-02-21 DIAGNOSIS — M069 Rheumatoid arthritis, unspecified: Secondary | ICD-10-CM | POA: Diagnosis not present

## 2024-02-21 DIAGNOSIS — N1831 Chronic kidney disease, stage 3a: Secondary | ICD-10-CM | POA: Diagnosis not present

## 2024-02-21 DIAGNOSIS — I10 Essential (primary) hypertension: Secondary | ICD-10-CM | POA: Diagnosis not present

## 2024-02-21 DIAGNOSIS — Z6822 Body mass index (BMI) 22.0-22.9, adult: Secondary | ICD-10-CM | POA: Diagnosis not present
